# Patient Record
Sex: Female | Born: 1995 | Race: White | Hispanic: No | Marital: Married | Smoking: Former smoker
Health system: Southern US, Community
[De-identification: ages and names within clinical notes are randomized; demographics above are authoritative.]

## PROBLEM LIST (undated history)

## (undated) DIAGNOSIS — Z789 Other specified health status: Secondary | ICD-10-CM

## (undated) DIAGNOSIS — K8020 Calculus of gallbladder without cholecystitis without obstruction: Secondary | ICD-10-CM

## (undated) HISTORY — PX: NO PAST SURGERIES: SHX2092

---
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
## 2013-04-19 ENCOUNTER — Encounter: Payer: BC Managed Care – PPO | Admitting: Women's Health

## 2013-06-08 ENCOUNTER — Ambulatory Visit (INDEPENDENT_AMBULATORY_CARE_PROVIDER_SITE_OTHER): Payer: BC Managed Care – PPO | Admitting: Gynecology

## 2013-06-08 ENCOUNTER — Encounter: Payer: Self-pay | Admitting: Gynecology

## 2013-06-08 VITALS — BP 118/70

## 2013-06-08 DIAGNOSIS — Z113 Encounter for screening for infections with a predominantly sexual mode of transmission: Secondary | ICD-10-CM

## 2013-06-08 DIAGNOSIS — R300 Dysuria: Secondary | ICD-10-CM

## 2013-06-08 DIAGNOSIS — N898 Other specified noninflammatory disorders of vagina: Secondary | ICD-10-CM

## 2013-06-08 LAB — URINALYSIS W MICROSCOPIC + REFLEX CULTURE
BILIRUBIN URINE: NEGATIVE
GLUCOSE, UA: NEGATIVE mg/dL
HGB URINE DIPSTICK: NEGATIVE
KETONES UR: NEGATIVE mg/dL
Leukocytes, UA: NEGATIVE
Nitrite: NEGATIVE
PROTEIN: NEGATIVE mg/dL
Specific Gravity, Urine: >1.03 — ABNORMAL HIGH (ref 1.005–1.030)
Urobilinogen, UA: 0.2 mg/dL (ref 0.0–1.0)
pH: 5 (ref 5.0–8.0)

## 2013-06-08 LAB — WET PREP FOR TRICH, YEAST, CLUE
Clue Cells Wet Prep HPF POC: NONE SEEN
TRICH WET PREP: NONE SEEN
YEAST WET PREP: NONE SEEN

## 2013-06-08 LAB — HEPATITIS C ANTIBODY: HCV Ab: NEGATIVE

## 2013-06-08 LAB — HEPATITIS B SURFACE ANTIGEN: Hepatitis B Surface Ag: NEGATIVE

## 2013-06-08 LAB — RPR

## 2013-06-08 LAB — HIV ANTIBODY (ROUTINE TESTING W REFLEX): HIV: NONREACTIVE

## 2013-06-08 MED ORDER — METRONIDAZOLE 0.75 % VA GEL: 1.0000 | Freq: Two times a day (BID) | VAGINAL | Status: DC

## 2013-06-08 NOTE — Patient Instructions (Signed)
Come to office when cycle starts to have depo provera injection

## 2013-06-08 NOTE — Progress Notes (Signed)
   18 year old patient that presented to the office today with symptoms of dysuria and frequency of concern if she was having recurrent urinary tract infections. She also is having some low back discomfort she had a vaginal discharge. She also has informed me that her partner I told her that he has herpes and she wanted to be screened as well as full STD evaluation.  Review of her records indicated that she had Escherichia coli urinary tract infection in August of 2013 in June of 2014. Patient denied any fever, chills, nausea or vomiting.  Exam: Back: No CVA tenderness Abdomen: Soft nontender no rebound guarding Pelvic: Bartholin urethra Skene was within normal limits Vagina: No lesions or discharge Cervix: No lesions or discharge Bimanual exam: Not done  Wet prep few WBC few bacteria  Urinalysis negative  Assessment/plan: Nonspecific bacterial vaginitis will be given MetroGel to apply each bedtime for one week. GC and chlamydia culture pending at time of this dictation. The following lab work was ordered to complete the STD screen: HSV 1 and HSV 2 IgG and IgM, HIV, RPR, hepatitis B and C. and HIV. Her urine will be submitted for culture as well. Review of her rectum medications overdue for her annual exam will make an appointment for next month. Patient was on oral contraceptive pill and discontinued 3 months ago and would like to go back on the Depo-Provera which she had been on in the past and we'll wait for the started the cycle and come in for the injection. Will discuss with her at that point about the HPV vaccine series.

## 2013-06-10 LAB — URINE CULTURE

## 2013-06-10 LAB — GC/CHLAMYDIA PROBE AMP
CT Probe RNA: NEGATIVE
GC Probe RNA: NEGATIVE

## 2013-06-11 LAB — HSV(HERPES SMPLX)ABS-I+II(IGG+IGM)-BLD
HERPES SIMPLEX VRS I-IGM AB (EIA): 0.73 {index}
HSV 1 Glycoprotein G Ab, IgG: <0.1 IV
HSV 2 Glycoprotein G Ab, IgG: <0.1 IV

## 2013-07-11 ENCOUNTER — Encounter: Payer: BC Managed Care – PPO | Admitting: Women's Health

## 2014-11-06 ENCOUNTER — Emergency Department (HOSPITAL_COMMUNITY)
Admission: EM | Admit: 2014-11-06 | Discharge: 2014-11-06 | Disposition: A | Discharge status: Home / Self Care | Payer: BLUE CROSS/BLUE SHIELD | Source: Home / Self Care | Attending: Family Medicine | Admitting: Family Medicine

## 2014-11-06 ENCOUNTER — Encounter (HOSPITAL_COMMUNITY): Payer: Self-pay | Admitting: *Deleted

## 2014-11-06 DIAGNOSIS — L723 Sebaceous cyst: Secondary | ICD-10-CM

## 2014-11-06 MED ORDER — DOXYCYCLINE HYCLATE 100 MG PO CAPS: 100.0000 mg | ORAL_CAPSULE | Freq: Two times a day (BID) | ORAL | Status: DC

## 2014-11-06 NOTE — ED Notes (Signed)
Pt    Has    A  Small  Boil  Behind  Her  r  Ear  That    She  Noticed perhaps 3  Days      Ago      Pt  Reports      The  Area     Is  Tender  To  The  Touch

## 2014-11-06 NOTE — ED Provider Notes (Signed)
CSN: [id number]     Arrival date & time 11/06/14  1332 History   First MD Initiated Contact with Patient 11/06/14 1432     No chief complaint on file.  (Consider location/radiation/quality/duration/timing/severity/associated sxs/prior Treatment) Patient is a 19 y.o. female presenting with abscess.  Abscess Location:  Head/neck Head/neck abscess location:  R ear Abscess quality: fluctuance, painful and redness   Abscess quality: not draining, no induration, no warmth and not weeping   Red streaking: no   Duration:  3 days Progression:  Unchanged Pain details:    Quality:  Pressure   Severity:  Mild Chronicity:  New Relieved by:  None tried Worsened by:  Nothing tried Ineffective treatments:  None tried Associated symptoms: no fever, no nausea and no vomiting   Risk factors: no prior abscess     No past medical history on file. No past surgical history on file. Family History  Problem Relation Age of Onset  . Heart disease Mother   . Hypertension Mother 69    PULMONARY ARTERIAL HYPERTENSION   History  Substance Use Topics  . Smoking status: Never Smoker   . Smokeless tobacco: Never Used  . Alcohol Use: Yes   OB History    Gravida Para Term Preterm AB TAB SAB Ectopic Multiple Living   0              Review of Systems  Constitutional: Negative for fever.  HENT: Positive for ear pain. Negative for ear discharge and facial swelling.   Gastrointestinal: Negative for nausea and vomiting.  Hematological: Negative for adenopathy.    Allergies  Review of patient's allergies indicates no known allergies.  Home Medications   Prior to Admission medications   Medication Sig Start Date End Date Taking? Authorizing Provider  medroxyPROGESTERone (DEPO-PROVERA) 150 MG/ML injection Inject 1 mL (150 mg total) into the muscle every 3 (three) months. 07/28/12   Harrington Challenger, NP  methylphenidate (RITALIN) 20 MG tablet Take 20 mg by mouth 2 (two) times daily.    Historical Provider,  MD  metroNIDAZOLE (METROGEL) 0.75 % vaginal gel Place 1 Applicatorful vaginally 2 (two) times daily. 06/08/13   Ok Edwards, MD   There were no vitals taken for this visit. Physical Exam  Constitutional: She appears well-developed and well-nourished. No distress.  HENT:  Head:    Left Ear: External ear normal.  Eyes: Conjunctivae are normal. Pupils are equal, round, and reactive to light.  Neck: Normal range of motion. Neck supple.  Nursing note and vitals reviewed.   ED Course  Procedures (including critical care time) Labs Review Labs Reviewed - No data to display  Imaging Review No results found.   MDM  No diagnosis found.     Linna Hoff, MD 11/06/14 [phone]

## 2014-11-06 NOTE — Discharge Instructions (Signed)
Warm compress twice a day when you take the antibiotic, take all of medicine, return as needed. °

## 2014-12-17 ENCOUNTER — Telehealth: Payer: Self-pay

## 2014-12-17 NOTE — Telephone Encounter (Signed)
Patient has CE scheduled next week. [place] to [place]. She said she will talk with her about it at viist. I told her that was something [place] could help her with.

## 2014-12-26 ENCOUNTER — Encounter: Payer: Self-pay | Admitting: Women's Health

## 2015-02-18 ENCOUNTER — Emergency Department (INDEPENDENT_AMBULATORY_CARE_PROVIDER_SITE_OTHER): Payer: BLUE CROSS/BLUE SHIELD

## 2015-02-18 ENCOUNTER — Emergency Department (HOSPITAL_COMMUNITY)
Admission: EM | Admit: 2015-02-18 | Discharge: 2015-02-18 | Disposition: A | Discharge status: Home / Self Care | Payer: BLUE CROSS/BLUE SHIELD | Source: Home / Self Care | Attending: Emergency Medicine | Admitting: Emergency Medicine

## 2015-02-18 ENCOUNTER — Encounter (HOSPITAL_COMMUNITY): Payer: Self-pay | Admitting: Emergency Medicine

## 2015-02-18 DIAGNOSIS — S96912A Strain of unspecified muscle and tendon at ankle and foot level, left foot, initial encounter: Secondary | ICD-10-CM

## 2015-02-18 DIAGNOSIS — S96911A Strain of unspecified muscle and tendon at ankle and foot level, right foot, initial encounter: Secondary | ICD-10-CM

## 2015-02-18 NOTE — ED Provider Notes (Signed)
CSN: [id number]     Arrival date & time 02/18/15  1656 History   First MD Initiated Contact with Patient 02/18/15 1811     Chief Complaint  Patient presents with  . Foot Pain  . Rash   (Consider location/radiation/quality/duration/timing/severity/associated sxs/prior Treatment) HPI Comments: 19 year old female presents with pain to the left forefoot for one week. Pain and swelling is located primarily to the base of the great toe and over the distal metatarsal. It is worse with weightbearing. She is unaware of any known trauma.  Second complaint is that of an itchy rash to her neck for 2 days.   History reviewed. No pertinent past medical history. History reviewed. No pertinent past surgical history. Family History  Problem Relation Age of Onset  . Heart disease Mother   . Hypertension Mother [id number]    PULMONARY ARTERIAL HYPERTENSION   Social History  Substance Use Topics  . Smoking status: Never Smoker   . Smokeless tobacco: Never Used  . Alcohol Use: Yes   OB History    Gravida Para Term Preterm AB TAB SAB Ectopic Multiple Living   0              Review of Systems  Constitutional: Positive for activity change. Negative for fever.  HENT: Positive for postnasal drip.   Respiratory: Negative.   Genitourinary: Negative.   Musculoskeletal:       As per history of present illness  Skin: Positive for rash.  Neurological: Negative.     Allergies  Review of patient's allergies indicates no known allergies.  Home Medications   Prior to Admission medications   Medication Sig Start Date End Date Taking? Authorizing Provider  doxycycline (VIBRAMYCIN) 100 MG capsule Take 1 capsule (100 mg total) by mouth 2 (two) times daily. Patient not taking: Reported on 02/18/2015 11/06/14   Linna HoffJames D Kindl, MD  medroxyPROGESTERone (DEPO-PROVERA) 150 MG/ML injection Inject 1 mL (150 mg total) into the muscle every 3 (three) months. Patient not taking: Reported on 02/18/2015 07/28/12   Harrington ChallengerNancy J  Young, NP  methylphenidate (RITALIN) 20 MG tablet Take 20 mg by mouth 2 (two) times daily.    Historical Provider, MD  metroNIDAZOLE (METROGEL) 0.75 % vaginal gel Place 1 Applicatorful vaginally 2 (two) times daily. Patient not taking: Reported on 02/18/2015 06/08/13   Ok EdwardsJuan H Fernandez, MD   Meds Ordered and Administered this Visit  Medications - No data to display  BP 124/79 mmHg  Pulse 67  Temp(Src) 98.4 F (36.9 C) (Oral)  Resp 16  SpO2 99%  LMP 02/04/2015 No data found.   Physical Exam  Constitutional: She is oriented to person, place, and time. She appears well-developed and well-nourished. No distress.  Neck: Normal range of motion. Neck supple.  Cardiovascular: Normal rate.   Pulmonary/Chest: Effort normal. No respiratory distress.  Musculoskeletal: She exhibits edema and tenderness.  Swelling and tenderness to the left forefoot primarily over the first metatarsal at the base of the great toe. Distal neurovascular motor Sentry is intact. Pedal pulse is 2+.  Neurological: She is alert and oriented to person, place, and time. She exhibits normal muscle tone.  Skin: Skin is warm and dry. No erythema.  No discoloration or obvious rash to the posterior neck. There is an appearance of "goose bumps" to the posterior neck but no erythema, papules or pustules visible.  Psychiatric: She has a normal mood and affect.  Nursing note and vitals reviewed.   ED Course  Procedures (including critical care time)  Labs Review Labs Reviewed - No data to display  Imaging Review Dg Foot Complete Left  02/18/2015  CLINICAL DATA:  Subacute onset of left foot pain, with dorsal swelling. Initial encounter. EXAM: LEFT FOOT - COMPLETE 3+ VIEW COMPARISON:  None. FINDINGS: There is no evidence of fracture or dislocation. The joint spaces are preserved. There is no evidence of talar subluxation; the subtalar joint is unremarkable in appearance. Mild dorsal soft tissue swelling is suggested at the  forefoot. IMPRESSION: No evidence of fracture or dislocation. Electronically Signed   By: Roanna Raider M.D.   On: 02/18/2015 18:54     Visual Acuity Review  Right Eye Distance:   Left Eye Distance:   Bilateral Distance:    Right Eye Near:   Left Eye Near:    Bilateral Near:         MDM   1. Strain of toe of left foot, initial encounter   2. Right foot strain, initial encounter    Most likely this is a strain of the tendons or sprain of the ligaments at the base of the toe and in the forefoot. This should heal over time without much intervention. Do not wear high heels. Do not wear narrowed toe shoes. Apply ice periodically. I take ibuprofen for pain. For any worsening may follow up with your primary care doctor.   Hayden Rasmussen, NP 02/18/15 [phone]

## 2015-02-18 NOTE — ED Notes (Signed)
Patient reports pain in left foot particularly around joint at base of great toe.  Foot swollen, unable to move great toes due to pain.  Can move the remainder of toes.  No known injury.  Patient reports wearing "very high heels" on halloween and was intoxicated-but not aware of any particular injury.   Rash on back of neck for 2 days

## 2015-02-18 NOTE — Discharge Instructions (Signed)
Most likely this is a strain of the tendons or sprain of the ligaments at the base of the toe and in the forefoot. This should heal over time without much intervention. Do not wear high heels. Do not wear narrowed toe shoes. Apply ice periodically. I take ibuprofen for pain. For any worsening may follow up with your primary care doctor.

## 2015-06-05 ENCOUNTER — Encounter: Payer: Self-pay | Admitting: Women's Health

## 2015-06-23 DIAGNOSIS — Z1321 Encounter for screening for nutritional disorder: Secondary | ICD-10-CM | POA: Insufficient documentation

## 2015-06-23 DIAGNOSIS — F4541 Pain disorder exclusively related to psychological factors: Secondary | ICD-10-CM

## 2015-06-23 DIAGNOSIS — Z8659 Personal history of other mental and behavioral disorders: Secondary | ICD-10-CM

## 2015-06-23 DIAGNOSIS — E669 Obesity, unspecified: Secondary | ICD-10-CM | POA: Insufficient documentation

## 2015-06-23 HISTORY — DX: Pain disorder exclusively related to psychological factors: F45.41

## 2015-06-23 HISTORY — DX: Personal history of other mental and behavioral disorders: Z86.59

## 2016-05-19 ENCOUNTER — Ambulatory Visit (INDEPENDENT_AMBULATORY_CARE_PROVIDER_SITE_OTHER): Payer: BLUE CROSS/BLUE SHIELD | Admitting: Women's Health

## 2016-05-19 ENCOUNTER — Encounter: Payer: Self-pay | Admitting: Women's Health

## 2016-05-19 VITALS — BP 124/80 | Ht 63.5 in | Wt 185.0 lb

## 2016-05-19 DIAGNOSIS — N926 Irregular menstruation, unspecified: Secondary | ICD-10-CM

## 2016-05-19 DIAGNOSIS — Z01419 Encounter for gynecological examination (general) (routine) without abnormal findings: Secondary | ICD-10-CM

## 2016-05-19 LAB — CBC WITH DIFFERENTIAL/PLATELET
BASOS ABS: 82 {cells}/uL (ref 0–200)
Basophils Relative: 1 %
Eosinophils Absolute: 82 cells/uL (ref 15–500)
Eosinophils Relative: 1 %
HEMATOCRIT: 43.1 % (ref 35.0–45.0)
HEMOGLOBIN: 14.3 g/dL (ref 11.7–15.5)
LYMPHS PCT: 35 %
Lymphs Abs: 2870 cells/uL (ref 850–3900)
MCH: 29.3 pg (ref 27.0–33.0)
MCHC: 33.2 g/dL (ref 32.0–36.0)
MCV: 88.3 fL (ref 80.0–100.0)
MPV: 9.8 fL (ref 7.5–12.5)
Monocytes Absolute: 492 cells/uL (ref 200–950)
Monocytes Relative: 6 %
NEUTROS PCT: 57 %
Neutro Abs: 4674 cells/uL (ref 1500–7800)
Platelets: 347 10*3/uL (ref 140–400)
RBC: 4.88 MIL/uL (ref 3.80–5.10)
RDW: 12.4 % (ref 11.0–15.0)
WBC: 8.2 10*3/uL (ref 3.8–10.8)

## 2016-05-19 LAB — TSH: TSH: 1.12 mIU/L

## 2016-05-19 NOTE — Progress Notes (Signed)
[name] [name] 04/10/1995 [id number]    History:    Presents for annual exam.  Last here 2015, reports having a negative STD screen with current partner. Received gardasil. Monthly 1-2 day cycles trying to conceive for the past 6 months. Reports husband is healthy but his brother had a low sperm count and questions if her husband does also.Marland Kitchen.  Past medical history, past surgical history, family history and social history were all reviewed and documented in the EPIC chart. 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  ROS:  A ROS was performed and pertinent positives and negatives are included.  Exam:  Vitals:   05/19/16 1149  BP: 124/80  Weight: 185 lb (83.9 kg)  Height: 5' 3.5" (1.613 m)   Body mass index is 32.26 kg/m.   General appearance:  Normal Thyroid:  Symmetrical, normal in size, without palpable masses or nodularity. Respiratory  Auscultation:  Clear without wheezing or rhonchi Cardiovascular  Auscultation:  Regular rate, without rubs, murmurs or gallops  Edema/varicosities:  Not grossly evident Abdominal  Soft,nontender, without masses, guarding or rebound.  Liver/spleen:  No organomegaly noted  Hernia:  None appreciated  Skin  Inspection:  Grossly normal   Breasts: Examined lying and sitting.     Right: Without masses, retractions, discharge or axillary adenopathy.     Left: Without masses, retractions, discharge or axillary adenopathy. Gentitourinary   Inguinal/mons:  Normal without inguinal adenopathy  External genitalia:  Normal  BUS/Urethra/Skene's glands:  Normal  Vagina:  Normal  Cervix:  Normal  Uterus: normal in size, shape and contour.  Midline and mobile  Adnexa/parametria:     Rt: Without masses or tenderness.   Lt: Without masses or tenderness.  Anus and perineum: Normal    Assessment/Plan:  21 y.o. [name] [name] for annual exam.   Monthly 1-2 day cycles desiring conception Obesity  Plan: On day 4 of cycle, will check FSH, estradiol, TSH, prolactin, CBC,  and Pap. Reviewed if normal will check progesterone on day 22-25 of cycle. Semen analysis. SBE's, exercise, decrease calories/carbs, calcium rich diet, MVI daily encouraged. Healthy pregnancy behaviors reviewed. Encouraged to increase intercourse frequency.   Harrington ChallengerYOUNG,[name] [name] WHNP, 1:21 PM 05/19/2016

## 2016-05-19 NOTE — Patient Instructions (Signed)

## 2016-05-20 ENCOUNTER — Telehealth: Payer: Self-pay | Admitting: *Deleted

## 2016-05-20 LAB — FOLLICLE STIMULATING HORMONE: FSH: 6.3 m[IU]/mL

## 2016-05-20 LAB — PROLACTIN: Prolactin: 9.7 ng/mL

## 2016-05-20 NOTE — Telephone Encounter (Signed)
Pt called asking if you would be willing to [facility], pt does have PCP that she could get referral from as well rather than GYN office, would you prefer for patient to get this from PCP? As we have no documentation to send to [facility] regarding this which they are going to require. Patient said she is overweight and her mother has heart disease. Please advise

## 2016-05-20 NOTE — Telephone Encounter (Signed)
Couldn't we just refer her for obesity and family history of heart disease ? Would obesity  not  be enough for a referral?.

## 2016-05-20 NOTE — Telephone Encounter (Signed)
Pt will get information and let me know to schedule.

## 2016-05-21 LAB — PAP IG W/ RFLX HPV ASCU

## 2016-05-21 NOTE — Telephone Encounter (Signed)
Pt called back with name of Delman KittenDebra Rosenquist [phone] she is with [facility], I called and was informed they are only accepting new patients that [facility]. I called pt and her PCP is with [facility] so pt will be able to get referral via PCP. Pt verbalized she understood this.

## 2016-05-28 LAB — ESTRADIOL, FREE
Estradiol, Free: 0.97 pg/mL
Estradiol: 60 pg/mL

## 2016-05-31 ENCOUNTER — Other Ambulatory Visit: Payer: Self-pay | Admitting: Women's Health

## 2016-05-31 DIAGNOSIS — Z319 Encounter for procreative management, unspecified: Secondary | ICD-10-CM

## 2016-07-08 ENCOUNTER — Ambulatory Visit (INDEPENDENT_AMBULATORY_CARE_PROVIDER_SITE_OTHER): Payer: BLUE CROSS/BLUE SHIELD | Admitting: Gynecology

## 2016-07-08 ENCOUNTER — Encounter: Payer: Self-pay | Admitting: Gynecology

## 2016-07-08 VITALS — BP 122/80 | Ht 63.5 in | Wt 189.2 lb

## 2016-07-08 DIAGNOSIS — Z3A01 Less than 8 weeks gestation of pregnancy: Secondary | ICD-10-CM

## 2016-07-08 DIAGNOSIS — N912 Amenorrhea, unspecified: Secondary | ICD-10-CM

## 2016-07-08 DIAGNOSIS — R110 Nausea: Secondary | ICD-10-CM

## 2016-07-08 DIAGNOSIS — Z3200 Encounter for pregnancy test, result unknown: Secondary | ICD-10-CM | POA: Insufficient documentation

## 2016-07-08 LAB — PREGNANCY, URINE: Preg Test, Ur: POSITIVE — AB

## 2016-07-08 MED ORDER — METOCLOPRAMIDE HCL 10 MG PO TABS: 10.0000 mg | ORAL_TABLET | Freq: Three times a day (TID) | ORAL | 1 refills | Status: DC

## 2016-07-08 NOTE — Progress Notes (Signed)
   Patient is a 21 year old now gravida 1 para 0 that presented to the office today stating that her last menstrual period was 06/03/2016. She has been trying to get pregnant for the past 6 months. She reports normal menstrual cycles. She did 3 home urine pregnancy test which were positive. Patient with some nausea slight breast tenderness no vomiting and no vaginal bleeding. Patient denies any past history of any STDs or any pelvic surgery and is currently married. Both her and her husband denied any family history of any genetic disorders.  Exam: Abdomen: Soft nontender no rebound or guarding Pelvic: Bartholin urethra Skene was within normal limits Vagina: No lesions or discharge Cervix: No lesions or discharge Uterus: Slightly anteverted four-week size nontender Adnexa: No palpable mass or tenderness Rectal exam: Not done  Urine presented test done here in the office was positive  Assessment/plan: Patient with with early pregnancy based on last menstrual period she would be approximate 4-6/[redacted] weeks gestation with a tentative estimated date of confinement 03/11/2017. She will have a quantitative beta-hCG today repeat in 72 hours. We'll tentatively schedule an ultrasound for viability and dating for 2 weeks. She was instructed to continue her prenatal vitamins. For the nausea given up her prescription for Reglan to take 1 by mouth every 6-8 hours when necessary. Literature information on first trimester pregnancy was provided.

## 2016-07-08 NOTE — Patient Instructions (Signed)
First Trimester of Pregnancy The first trimester of pregnancy is from week 1 until the end of week 13 (months 1 through 3). A week after a sperm fertilizes an egg, the egg will implant on the wall of the uterus. This embryo will begin to develop into a baby. Genes from you and your partner will form the baby. The female genes will determine whether the baby will be a boy or a girl. At 6-8 weeks, the eyes and face will be formed, and the heartbeat can be seen on ultrasound. At the end of 12 weeks, all the baby's organs will be formed. Now that you are pregnant, you will want to do everything you can to have a healthy baby. Two of the most important things are to get good prenatal care and to follow your health care provider's instructions. Prenatal care is all the medical care you receive before the baby's birth. This care will help prevent, find, and treat any problems during the pregnancy and childbirth. Body changes during your first trimester Your body goes through many changes during pregnancy. The changes vary from woman to woman.  You may gain or lose a couple of pounds at first.  You may feel sick to your stomach (nauseous) and you may throw up (vomit). If the vomiting is uncontrollable, call your health care provider.  You may tire easily.  You may develop headaches that can be relieved by medicines. All medicines should be approved by your health care provider.  You may urinate more often. Painful urination may mean you have a bladder infection.  You may develop heartburn as a result of your pregnancy.  You may develop constipation because certain hormones are causing the muscles that push stool through your intestines to slow down.  You may develop hemorrhoids or swollen veins (varicose veins).  Your breasts may begin to grow larger and become tender. Your nipples may stick out more, and the tissue that surrounds them (areola) may become darker.  Your gums may bleed and may be  sensitive to brushing and flossing.  Dark spots or blotches (chloasma, mask of pregnancy) may develop on your face. This will likely fade after the baby is born.  Your menstrual periods will stop.  You may have a loss of appetite.  You may develop cravings for certain kinds of food.  You may have changes in your emotions from day to day, such as being excited to be pregnant or being concerned that something may go wrong with the pregnancy and baby.  You may have more vivid and strange dreams.  You may have changes in your hair. These can include thickening of your hair, rapid growth, and changes in texture. Some women also have hair loss during or after pregnancy, or hair that feels dry or thin. Your hair will most likely return to normal after your baby is born.  What to expect at prenatal visits During a routine prenatal visit:  You will be weighed to make sure you and the baby are growing normally.  Your blood pressure will be taken.  Your abdomen will be measured to track your baby's growth.  The fetal heartbeat will be listened to between weeks 10 and 14 of your pregnancy.  Test results from any previous visits will be discussed.  Your health care provider may ask you:  How you are feeling.  If you are feeling the baby move.  If you have had any abnormal symptoms, such as leaking fluid, bleeding, severe headaches,   or abdominal cramping.  If you are using any tobacco products, including cigarettes, chewing tobacco, and electronic cigarettes.  If you have any questions.  Other tests that may be performed during your first trimester include:  Blood tests to find your blood type and to check for the presence of any previous infections. The tests will also be used to check for low iron levels (anemia) and protein on red blood cells (Rh antibodies). Depending on your risk factors, or if you previously had diabetes during pregnancy, you may have tests to check for high blood  sugar that affects pregnant women (gestational diabetes).  Urine tests to check for infections, diabetes, or protein in the urine.  An ultrasound to confirm the proper growth and development of the baby.  Fetal screens for spinal cord problems (spina bifida) and Down syndrome.  HIV (human immunodeficiency virus) testing. Routine prenatal testing includes screening for HIV, unless you choose not to have this test.  You may need other tests to make sure you and the baby are doing well.  Follow these instructions at home: Medicines  Follow your health care provider's instructions regarding medicine use. Specific medicines may be either safe or unsafe to take during pregnancy.  Take a prenatal vitamin that contains at least 600 micrograms (mcg) of folic acid.  If you develop constipation, try taking a stool softener if your health care provider approves. Eating and drinking  Eat a balanced diet that includes fresh fruits and vegetables, whole grains, good sources of protein such as meat, eggs, or tofu, and low-fat dairy. Your health care provider will help you determine the amount of weight gain that is right for you.  Avoid raw meat and uncooked cheese. These carry germs that can cause birth defects in the baby.  Eating four or five small meals rather than three large meals a day may help relieve nausea and vomiting. If you start to feel nauseous, eating a few soda crackers can be helpful. Drinking liquids between meals, instead of during meals, also seems to help ease nausea and vomiting.  Limit foods that are high in fat and processed sugars, such as fried and sweet foods.  To prevent constipation: ? Eat foods that are high in fiber, such as fresh fruits and vegetables, whole grains, and beans. ? Drink enough fluid to keep your urine clear or pale yellow. Activity  Exercise only as directed by your health care provider. Most women can continue their usual exercise routine during  pregnancy. Try to exercise for 30 minutes at least 5 days a week. Exercising will help you: ? Control your weight. ? Stay in shape. ? Be prepared for labor and delivery.  Experiencing pain or cramping in the lower abdomen or lower back is a good sign that you should stop exercising. Check with your health care provider before continuing with normal exercises.  Try to avoid standing for long periods of time. Move your legs often if you must stand in one place for a long time.  Avoid heavy lifting.  Wear low-heeled shoes and practice good posture.  You may continue to have sex unless your health care provider tells you not to. Relieving pain and discomfort  Wear a good support bra to relieve breast tenderness.  Take warm sitz baths to soothe any pain or discomfort caused by hemorrhoids. Use hemorrhoid cream if your health care provider approves.  Rest with your legs elevated if you have leg cramps or low back pain.  If you develop   varicose veins in your legs, wear support hose. Elevate your feet for 15 minutes, 3-4 times a day. Limit salt in your diet. Prenatal care  Schedule your prenatal visits by the twelfth week of pregnancy. They are usually scheduled monthly at first, then more often in the last 2 months before delivery.  Write down your questions. Take them to your prenatal visits.  Keep all your prenatal visits as told by your health care provider. This is important. Safety  Wear your seat belt at all times when driving.  Make a list of emergency phone numbers, including numbers for family, friends, the hospital, and police and fire departments. General instructions  Ask your health care provider for a referral to a local prenatal education class. Begin classes no later than the beginning of month 6 of your pregnancy.  Ask for help if you have counseling or nutritional needs during pregnancy. Your health care provider can offer advice or refer you to specialists for help  with various needs.  Do not use hot tubs, steam rooms, or saunas.  Do not douche or use tampons or scented sanitary pads.  Do not cross your legs for long periods of time.  Avoid cat litter boxes and soil used by cats. These carry germs that can cause birth defects in the baby and possibly loss of the fetus by miscarriage or stillbirth.  Avoid all smoking, herbs, alcohol, and medicines not prescribed by your health care provider. Chemicals in these products affect the formation and growth of the baby.  Do not use any products that contain nicotine or tobacco, such as cigarettes and e-cigarettes. If you need help quitting, ask your health care provider. You may receive counseling support and other resources to help you quit.  Schedule a dentist appointment. At home, brush your teeth with a soft toothbrush and be gentle when you floss. Contact a health care provider if:  You have dizziness.  You have mild pelvic cramps, pelvic pressure, or nagging pain in the abdominal area.  You have persistent nausea, vomiting, or diarrhea.  You have a bad smelling vaginal discharge.  You have pain when you urinate.  You notice increased swelling in your face, hands, legs, or ankles.  You are exposed to fifth disease or chickenpox.  You are exposed to German measles (rubella) and have never had it. Get help right away if:  You have a fever.  You are leaking fluid from your vagina.  You have spotting or bleeding from your vagina.  You have severe abdominal cramping or pain.  You have rapid weight gain or loss.  You vomit blood or material that looks like coffee grounds.  You develop a severe headache.  You have shortness of breath.  You have any kind of trauma, such as from a fall or a car accident. Summary  The first trimester of pregnancy is from week 1 until the end of week 13 (months 1 through 3).  Your body goes through many changes during pregnancy. The changes vary from  woman to woman.  You will have routine prenatal visits. During those visits, your health care provider will examine you, discuss any test results you may have, and talk with you about how you are feeling. This information is not intended to replace advice given to you by your health care provider. Make sure you discuss any questions you have with your health care provider. Document Released: 03/16/2001 Document Revised: 03/03/2016 Document Reviewed: 03/03/2016 Elsevier Interactive Patient Education  2017 [employer]  

## 2016-07-09 LAB — HCG, QUANTITATIVE, PREGNANCY: hCG, Beta Chain, Quant, S: 86.4 m[IU]/mL — ABNORMAL HIGH

## 2016-07-20 ENCOUNTER — Other Ambulatory Visit: Payer: BLUE CROSS/BLUE SHIELD

## 2016-07-20 ENCOUNTER — Other Ambulatory Visit: Payer: Self-pay | Admitting: Gynecology

## 2016-07-20 DIAGNOSIS — Z3A01 Less than 8 weeks gestation of pregnancy: Secondary | ICD-10-CM

## 2016-07-20 DIAGNOSIS — Z3491 Encounter for supervision of normal pregnancy, unspecified, first trimester: Secondary | ICD-10-CM

## 2016-07-20 LAB — HCG, QUANTITATIVE, PREGNANCY: hCG, Beta Chain, Quant, S: 24703.1 m[IU]/mL — ABNORMAL HIGH

## 2016-07-22 ENCOUNTER — Encounter: Payer: Self-pay | Admitting: Gynecology

## 2016-07-22 ENCOUNTER — Other Ambulatory Visit: Payer: Self-pay | Admitting: Gynecology

## 2016-07-22 ENCOUNTER — Ambulatory Visit (INDEPENDENT_AMBULATORY_CARE_PROVIDER_SITE_OTHER): Payer: BLUE CROSS/BLUE SHIELD | Admitting: Gynecology

## 2016-07-22 ENCOUNTER — Ambulatory Visit (INDEPENDENT_AMBULATORY_CARE_PROVIDER_SITE_OTHER): Payer: BLUE CROSS/BLUE SHIELD

## 2016-07-22 DIAGNOSIS — Z3491 Encounter for supervision of normal pregnancy, unspecified, first trimester: Secondary | ICD-10-CM

## 2016-07-22 DIAGNOSIS — O368311 Maternal care for abnormalities of the fetal heart rate or rhythm, first trimester, fetus 1: Secondary | ICD-10-CM

## 2016-07-22 DIAGNOSIS — O3680X0 Pregnancy with inconclusive fetal viability, not applicable or unspecified: Secondary | ICD-10-CM

## 2016-07-22 DIAGNOSIS — O2691 Pregnancy related conditions, unspecified, first trimester: Secondary | ICD-10-CM | POA: Diagnosis not present

## 2016-07-22 NOTE — Progress Notes (Signed)
   Patient is a 21 year old now gravida 1 para 0 who was seen in the office on April 5 as a result of 3 positive home pregnancy test which was confirmed in the office. She had serial quantitative beta-hCG's that were drawn and the results were as follows:  Results for [name] [name], [name] [name] (MRN [id number]) as of 07/22/2016 16:05  Ref. Range 07/08/2016 12:57 07/20/2016 09:51  HCG, Beta Chain, Quant, S Latest Units: mIU/mL 86.4 (H) 24,703.1 (H)   Patient is doing well some nausea is taking Reglan when necessary and is on prenatal vitamins reports no vaginal bleeding. Ultrasound today: By last menstrual period patient would be 7 weeks with a due date 03/10/2017 by ultrasound today 5 weeks and 4 days with a due date 03/20/2017  Intrauterine gestational sac less than dates noted faint cardiac activity 84 bpm with reported with a normal yolk sac. Adjacent to the gestational sac a fluid-filled irregular area measuring 6 x 6 x 2 mm was noted the hypoechoic ring could not rule out the possibility of a twin gestation versus a hematoma. Right ovary was normal left ovary corpus luteum cyst measuring 16 x 17 mm. No fluid in cul-de-sac. No apparent adnexal masses.  Assessment/plan: First trimester pregnancy size less than dates patient could've conceived later than based on her last menstrual period. Questionable vanishing twin versus small hematoma will take threatened miscarriage precautions and return back to the office in 2 weeks for an ultrasound. Quantitative beta-hCGs have been rising nicely. Instructions provided.

## 2016-07-27 ENCOUNTER — Telehealth: Payer: Self-pay

## 2016-07-27 NOTE — Telephone Encounter (Signed)
She can take a Colace tablet when necessary. Drink plenty of fluids increase her fiber intake. No alcohol no heart tones or Jacuzzis and she can take plain Tylenol when necessary. No dietary restriction

## 2016-07-27 NOTE — Telephone Encounter (Signed)
Patient said she was in last week and found out she was early pregnant. Concerned because she did not get a list of medications she can take, Foods she should not eat and nutrients she needs.  Also, she is constipated and you had recommend no straining and no abd exercises for 2 weeks. What to rec?

## 2016-07-28 NOTE — Telephone Encounter (Signed)
Patient informed. 

## 2016-08-02 ENCOUNTER — Other Ambulatory Visit: Payer: BLUE CROSS/BLUE SHIELD

## 2016-08-02 ENCOUNTER — Ambulatory Visit: Payer: BLUE CROSS/BLUE SHIELD | Admitting: Gynecology

## 2016-08-04 ENCOUNTER — Ambulatory Visit (INDEPENDENT_AMBULATORY_CARE_PROVIDER_SITE_OTHER): Payer: BLUE CROSS/BLUE SHIELD

## 2016-08-04 ENCOUNTER — Ambulatory Visit (INDEPENDENT_AMBULATORY_CARE_PROVIDER_SITE_OTHER): Payer: BLUE CROSS/BLUE SHIELD | Admitting: Gynecology

## 2016-08-04 ENCOUNTER — Other Ambulatory Visit: Payer: Self-pay | Admitting: Gynecology

## 2016-08-04 ENCOUNTER — Encounter: Payer: Self-pay | Admitting: Gynecology

## 2016-08-04 VITALS — BP 126/80

## 2016-08-04 DIAGNOSIS — O200 Threatened abortion: Secondary | ICD-10-CM

## 2016-08-04 DIAGNOSIS — Z3490 Encounter for supervision of normal pregnancy, unspecified, unspecified trimester: Secondary | ICD-10-CM

## 2016-08-04 DIAGNOSIS — N898 Other specified noninflammatory disorders of vagina: Secondary | ICD-10-CM | POA: Diagnosis not present

## 2016-08-04 DIAGNOSIS — Z3A08 8 weeks gestation of pregnancy: Secondary | ICD-10-CM

## 2016-08-04 DIAGNOSIS — Z3491 Encounter for supervision of normal pregnancy, unspecified, first trimester: Secondary | ICD-10-CM

## 2016-08-04 LAB — WET PREP FOR TRICH, YEAST, CLUE
Clue Cells Wet Prep HPF POC: NONE SEEN
Trich, Wet Prep: NONE SEEN
WBC, Wet Prep HPF POC: NONE SEEN
YEAST WET PREP: NONE SEEN

## 2016-08-04 NOTE — Progress Notes (Signed)
   Patient is a 21 year old now gravida 1 who was seen in the office on April 19 and is here for follow-up ultrasound based on findings at time of ultrasound at that office visit. Her history is as follows  "Patient was seen in the office on April 5 as a result of 3 positive home pregnancy test which was confirmed in the office. She had serial quantitative beta-hCG's that were drawn and the results were as follows:  Results for SUNSHINE, MACKOWSKI (MRN [id number]) as of 07/22/2016 16:05  Ref. Range 07/08/2016 12:57 07/20/2016 09:51  HCG, Beta Chain, Quant, S Latest Units: mIU/mL 86.4 (H) 24,703.1 (H)   Patient is doing well some nausea is taking Reglan when necessary and is on prenatal vitamins reports no vaginal bleeding. Ultrasound today: By last menstrual period patient would be 7 weeks with a due date 03/10/2017 by ultrasound today 5 weeks and 4 days with a due date 03/20/2017  Intrauterine gestational sac less than dates noted faint cardiac activity 84 bpm with reported with a normal yolk sac. Adjacent to the gestational sac a fluid-filled irregular area measuring 6 x 6 x 2 mm was noted the hypoechoic ring could not rule out the possibility of a twin gestation versus a hematoma. Right ovary was normal left ovary corpus luteum cyst measuring 16 x 17 mm. No fluid in cul-de-sac. No apparent adnexal masses."  The assessment and plan from an office visit was reported as follows: "First trimester pregnancy size less than dates patient could've conceived later than based on her last menstrual period. Questionable vanishing twin versus small hematoma will take threatened miscarriage precautions and return back to the office in 2 weeks for an ultrasound. Quantitative beta-hCGs have been rising nicely. Instructions provided"  Patient was experiencing slight vaginal discharge with no overt no pruritus so wet prep was done and was reported to be normal just few bacteria  Ultrasound today: Intrauterine pregnancy  size consistent with dates. Fetal pole crown-rump length 10.8 cm consistent with 7 weeks, cardiac activity 144 bpm were reported, cervix is long closed. Located cervix hypoechoic focus 10 x 4 x 17 mm with cystic area located at the cervix. Right ovary was normal left ovary corpus luteum cyst measuring 22 x 18 mm. Negative fluid in the cul-de-sac.  Assessment/plan: First trimester pregnancy size consistent with dates approximately 7 weeks with an estimated date of confinement 03/23/2017 still questionable small vanishing twin what appears to be a cystic area resorbing The cervix. Patient will be instructed to refrain from intercourse or strenuous activity until she is out of the first trimester. Literature information on do's and don'ts during the first trimester pregnancy was provided. Copy of the ultrasound was provided. She is on her prenatal vitamins. Patient was provided with the telephone number names of obstetrician in the community to follow her for her obstetrical care.

## 2016-08-04 NOTE — Patient Instructions (Signed)
First Trimester of Pregnancy The first trimester of pregnancy is from week 1 until the end of week 13 (months 1 through 3). A week after a sperm fertilizes an egg, the egg will implant on the wall of the uterus. This embryo will begin to develop into a baby. Genes from you and your partner will form the baby. The female genes will determine whether the baby will be a boy or a girl. At 6-8 weeks, the eyes and face will be formed, and the heartbeat can be seen on ultrasound. At the end of 12 weeks, all the baby's organs will be formed. Now that you are pregnant, you will want to do everything you can to have a healthy baby. Two of the most important things are to get good prenatal care and to follow your health care provider's instructions. Prenatal care is all the medical care you receive before the baby's birth. This care will help prevent, find, and treat any problems during the pregnancy and childbirth. Body changes during your first trimester Your body goes through many changes during pregnancy. The changes vary from woman to woman.  You may gain or lose a couple of pounds at first.  You may feel sick to your stomach (nauseous) and you may throw up (vomit). If the vomiting is uncontrollable, call your health care provider.  You may tire easily.  You may develop headaches that can be relieved by medicines. All medicines should be approved by your health care provider.  You may urinate more often. Painful urination may mean you have a bladder infection.  You may develop heartburn as a result of your pregnancy.  You may develop constipation because certain hormones are causing the muscles that push stool through your intestines to slow down.  You may develop hemorrhoids or swollen veins (varicose veins).  Your breasts may begin to grow larger and become tender. Your nipples may stick out more, and the tissue that surrounds them (areola) may become darker.  Your gums may bleed and may be  sensitive to brushing and flossing.  Dark spots or blotches (chloasma, mask of pregnancy) may develop on your face. This will likely fade after the baby is born.  Your menstrual periods will stop.  You may have a loss of appetite.  You may develop cravings for certain kinds of food.  You may have changes in your emotions from day to day, such as being excited to be pregnant or being concerned that something may go wrong with the pregnancy and baby.  You may have more vivid and strange dreams.  You may have changes in your hair. These can include thickening of your hair, rapid growth, and changes in texture. Some women also have hair loss during or after pregnancy, or hair that feels dry or thin. Your hair will most likely return to normal after your baby is born.  What to expect at prenatal visits During a routine prenatal visit:  You will be weighed to make sure you and the baby are growing normally.  Your blood pressure will be taken.  Your abdomen will be measured to track your baby's growth.  The fetal heartbeat will be listened to between weeks 10 and 14 of your pregnancy.  Test results from any previous visits will be discussed.  Your health care provider may ask you:  How you are feeling.  If you are feeling the baby move.  If you have had any abnormal symptoms, such as leaking fluid, bleeding, severe headaches,   or abdominal cramping.  If you are using any tobacco products, including cigarettes, chewing tobacco, and electronic cigarettes.  If you have any questions.  Other tests that may be performed during your first trimester include:  Blood tests to find your blood type and to check for the presence of any previous infections. The tests will also be used to check for low iron levels (anemia) and protein on red blood cells (Rh antibodies). Depending on your risk factors, or if you previously had diabetes during pregnancy, you may have tests to check for high blood  sugar that affects pregnant women (gestational diabetes).  Urine tests to check for infections, diabetes, or protein in the urine.  An ultrasound to confirm the proper growth and development of the baby.  Fetal screens for spinal cord problems (spina bifida) and Down syndrome.  HIV (human immunodeficiency virus) testing. Routine prenatal testing includes screening for HIV, unless you choose not to have this test.  You may need other tests to make sure you and the baby are doing well.  Follow these instructions at home: Medicines  Follow your health care provider's instructions regarding medicine use. Specific medicines may be either safe or unsafe to take during pregnancy.  Take a prenatal vitamin that contains at least 600 micrograms (mcg) of folic acid.  If you develop constipation, try taking a stool softener if your health care provider approves. Eating and drinking  Eat a balanced diet that includes fresh fruits and vegetables, whole grains, good sources of protein such as meat, eggs, or tofu, and low-fat dairy. Your health care provider will help you determine the amount of weight gain that is right for you.  Avoid raw meat and uncooked cheese. These carry germs that can cause birth defects in the baby.  Eating four or five small meals rather than three large meals a day may help relieve nausea and vomiting. If you start to feel nauseous, eating a few soda crackers can be helpful. Drinking liquids between meals, instead of during meals, also seems to help ease nausea and vomiting.  Limit foods that are high in fat and processed sugars, such as fried and sweet foods.  To prevent constipation: ? Eat foods that are high in fiber, such as fresh fruits and vegetables, whole grains, and beans. ? Drink enough fluid to keep your urine clear or pale yellow. Activity  Exercise only as directed by your health care provider. Most women can continue their usual exercise routine during  pregnancy. Try to exercise for 30 minutes at least 5 days a week. Exercising will help you: ? Control your weight. ? Stay in shape. ? Be prepared for labor and delivery.  Experiencing pain or cramping in the lower abdomen or lower back is a good sign that you should stop exercising. Check with your health care provider before continuing with normal exercises.  Try to avoid standing for long periods of time. Move your legs often if you must stand in one place for a long time.  Avoid heavy lifting.  Wear low-heeled shoes and practice good posture.  You may continue to have sex unless your health care provider tells you not to. Relieving pain and discomfort  Wear a good support bra to relieve breast tenderness.  Take warm sitz baths to soothe any pain or discomfort caused by hemorrhoids. Use hemorrhoid cream if your health care provider approves.  Rest with your legs elevated if you have leg cramps or low back pain.  If you develop   varicose veins in your legs, wear support hose. Elevate your feet for 15 minutes, 3-4 times a day. Limit salt in your diet. Prenatal care  Schedule your prenatal visits by the twelfth week of pregnancy. They are usually scheduled monthly at first, then more often in the last 2 months before delivery.  Write down your questions. Take them to your prenatal visits.  Keep all your prenatal visits as told by your health care provider. This is important. Safety  Wear your seat belt at all times when driving.  Make a list of emergency phone numbers, including numbers for family, friends, the hospital, and police and fire departments. General instructions  Ask your health care provider for a referral to a local prenatal education class. Begin classes no later than the beginning of month 6 of your pregnancy.  Ask for help if you have counseling or nutritional needs during pregnancy. Your health care provider can offer advice or refer you to specialists for help  with various needs.  Do not use hot tubs, steam rooms, or saunas.  Do not douche or use tampons or scented sanitary pads.  Do not cross your legs for long periods of time.  Avoid cat litter boxes and soil used by cats. These carry germs that can cause birth defects in the baby and possibly loss of the fetus by miscarriage or stillbirth.  Avoid all smoking, herbs, alcohol, and medicines not prescribed by your health care provider. Chemicals in these products affect the formation and growth of the baby.  Do not use any products that contain nicotine or tobacco, such as cigarettes and e-cigarettes. If you need help quitting, ask your health care provider. You may receive counseling support and other resources to help you quit.  Schedule a dentist appointment. At home, brush your teeth with a soft toothbrush and be gentle when you floss. Contact a health care provider if:  You have dizziness.  You have mild pelvic cramps, pelvic pressure, or nagging pain in the abdominal area.  You have persistent nausea, vomiting, or diarrhea.  You have a bad smelling vaginal discharge.  You have pain when you urinate.  You notice increased swelling in your face, hands, legs, or ankles.  You are exposed to fifth disease or chickenpox.  You are exposed to German measles (rubella) and have never had it. Get help right away if:  You have a fever.  You are leaking fluid from your vagina.  You have spotting or bleeding from your vagina.  You have severe abdominal cramping or pain.  You have rapid weight gain or loss.  You vomit blood or material that looks like coffee grounds.  You develop a severe headache.  You have shortness of breath.  You have any kind of trauma, such as from a fall or a car accident. Summary  The first trimester of pregnancy is from week 1 until the end of week 13 (months 1 through 3).  Your body goes through many changes during pregnancy. The changes vary from  woman to woman.  You will have routine prenatal visits. During those visits, your health care provider will examine you, discuss any test results you may have, and talk with you about how you are feeling. This information is not intended to replace advice given to you by your health care provider. Make sure you discuss any questions you have with your health care provider. Document Released: 03/16/2001 Document Revised: 03/03/2016 Document Reviewed: 03/03/2016 Elsevier Interactive Patient Education  2017 [employer]  

## 2016-08-18 ENCOUNTER — Encounter: Payer: Self-pay | Admitting: Gynecology

## 2016-08-27 ENCOUNTER — Telehealth: Payer: Self-pay

## 2016-08-27 MED ORDER — DOXYLAMINE-PYRIDOXINE 10-10 MG PO TBEC: 1.0000 | DELAYED_RELEASE_TABLET | Freq: Two times a day (BID) | ORAL | 1 refills | Status: DC

## 2016-08-27 NOTE — Telephone Encounter (Signed)
Encounter already opened. 

## 2016-08-27 NOTE — Telephone Encounter (Addendum)
Not familiar with Diclegis. I brought up the Rx here. Can you complete Rx for me. Also, how much Vit B6?

## 2016-08-27 NOTE — Telephone Encounter (Signed)
[redacted] weeks pregnant. Dr. Fontaine NoFernandez's patient. Sick with vomiting x 2 weeks (she questioned maybe morning sickness) and diarrhea x 2 days. Had a fever earlier past two days 99.8 but no fever today.  She asked what she should do for this.

## 2016-08-27 NOTE — Telephone Encounter (Signed)
11 wks with Vomitting x 2 wks.  Please recommend Vit B6 and send Diclegis prescription.  Diarrhea with subfebrile state could be a viral Gastro-Enteritis, but no fever now, hopefully improving.  Will need very good hydration and increased K+ intake (bananas...).  If thinks that she is dehydrated[facility]  No Obstetrician yet?

## 2016-08-27 NOTE — Telephone Encounter (Signed)
Spoke with patient and informed and instructed her on all this.  She has OB appt in  3 weeks.

## 2016-08-27 NOTE — Telephone Encounter (Signed)
Recommended dosage of Vit B6 for severe N/V of pregnancy is 25 to 50 mg TID as needed.  Diclegis recommended dosage is to start with 2 tab at bedtime.  If not enough after 2 days, add 1 tab in am.  If still not enough, take the maximum dosage which is 1 tab am, 1 tab at lunch and 2 tab at bedtime.

## 2016-09-09 DIAGNOSIS — Z3401 Encounter for supervision of normal first pregnancy, first trimester: Secondary | ICD-10-CM | POA: Diagnosis not present

## 2016-09-09 DIAGNOSIS — Z3689 Encounter for other specified antenatal screening: Secondary | ICD-10-CM | POA: Diagnosis not present

## 2016-09-09 LAB — OB RESULTS CONSOLE HEPATITIS B SURFACE ANTIGEN: HEP B S AG: NEGATIVE

## 2016-09-09 LAB — OB RESULTS CONSOLE RUBELLA ANTIBODY, IGM: Rubella: IMMUNE

## 2016-09-09 LAB — OB RESULTS CONSOLE HIV ANTIBODY (ROUTINE TESTING): HIV: NONREACTIVE

## 2016-09-30 DIAGNOSIS — Z3402 Encounter for supervision of normal first pregnancy, second trimester: Secondary | ICD-10-CM | POA: Diagnosis not present

## 2016-09-30 DIAGNOSIS — Z361 Encounter for antenatal screening for raised alphafetoprotein level: Secondary | ICD-10-CM | POA: Diagnosis not present

## 2016-09-30 DIAGNOSIS — D72829 Elevated white blood cell count, unspecified: Secondary | ICD-10-CM | POA: Diagnosis not present

## 2016-10-28 DIAGNOSIS — Z3402 Encounter for supervision of normal first pregnancy, second trimester: Secondary | ICD-10-CM | POA: Diagnosis not present

## 2016-10-28 DIAGNOSIS — Z363 Encounter for antenatal screening for malformations: Secondary | ICD-10-CM | POA: Diagnosis not present

## 2016-11-12 DIAGNOSIS — O4402 Placenta previa specified as without hemorrhage, second trimester: Secondary | ICD-10-CM | POA: Diagnosis not present

## 2016-11-12 DIAGNOSIS — Z3A21 21 weeks gestation of pregnancy: Secondary | ICD-10-CM | POA: Diagnosis not present

## 2016-12-09 DIAGNOSIS — O4402 Placenta previa specified as without hemorrhage, second trimester: Secondary | ICD-10-CM | POA: Diagnosis not present

## 2016-12-09 DIAGNOSIS — Z3A25 25 weeks gestation of pregnancy: Secondary | ICD-10-CM | POA: Diagnosis not present

## 2016-12-30 ENCOUNTER — Encounter (HOSPITAL_COMMUNITY): Payer: Self-pay

## 2016-12-30 DIAGNOSIS — Z3689 Encounter for other specified antenatal screening: Secondary | ICD-10-CM | POA: Diagnosis not present

## 2016-12-30 DIAGNOSIS — O4403 Placenta previa specified as without hemorrhage, third trimester: Secondary | ICD-10-CM | POA: Diagnosis not present

## 2016-12-30 DIAGNOSIS — Z3A28 28 weeks gestation of pregnancy: Secondary | ICD-10-CM | POA: Diagnosis not present

## 2016-12-30 DIAGNOSIS — O26893 Other specified pregnancy related conditions, third trimester: Secondary | ICD-10-CM | POA: Diagnosis not present

## 2016-12-30 LAB — OB RESULTS CONSOLE RPR: RPR: NONREACTIVE

## 2017-01-04 DIAGNOSIS — O99810 Abnormal glucose complicating pregnancy: Secondary | ICD-10-CM | POA: Diagnosis not present

## 2017-01-04 DIAGNOSIS — Z3A29 29 weeks gestation of pregnancy: Secondary | ICD-10-CM | POA: Diagnosis not present

## 2017-01-09 ENCOUNTER — Inpatient Hospital Stay (HOSPITAL_COMMUNITY)
Admission: AD | Admit: 2017-01-09 | Discharge: 2017-01-09 | Disposition: A | Discharge status: Home / Self Care | Payer: Medicaid Other | Source: Ambulatory Visit | Attending: Obstetrics and Gynecology | Admitting: Obstetrics and Gynecology

## 2017-01-09 ENCOUNTER — Encounter (HOSPITAL_COMMUNITY): Payer: Self-pay

## 2017-01-09 DIAGNOSIS — O98513 Other viral diseases complicating pregnancy, third trimester: Secondary | ICD-10-CM | POA: Diagnosis not present

## 2017-01-09 DIAGNOSIS — Z3A31 31 weeks gestation of pregnancy: Secondary | ICD-10-CM | POA: Insufficient documentation

## 2017-01-09 DIAGNOSIS — J069 Acute upper respiratory infection, unspecified: Secondary | ICD-10-CM | POA: Diagnosis not present

## 2017-01-09 DIAGNOSIS — O99513 Diseases of the respiratory system complicating pregnancy, third trimester: Secondary | ICD-10-CM | POA: Insufficient documentation

## 2017-01-09 DIAGNOSIS — N766 Ulceration of vulva: Secondary | ICD-10-CM | POA: Diagnosis not present

## 2017-01-09 LAB — INFLUENZA PANEL BY PCR (TYPE A & B)
INFLBPCR: NEGATIVE
Influenza A By PCR: NEGATIVE

## 2017-01-09 LAB — CBC WITH DIFFERENTIAL/PLATELET
BASOS ABS: 0 10*3/uL (ref 0.0–0.1)
BASOS PCT: 0 %
Eosinophils Absolute: 0.1 10*3/uL (ref 0.0–0.7)
Eosinophils Relative: 1 %
HEMATOCRIT: 34.9 % — AB (ref 36.0–46.0)
Hemoglobin: 11.9 g/dL — ABNORMAL LOW (ref 12.0–15.0)
Lymphocytes Relative: 19 %
Lymphs Abs: 2.5 10*3/uL (ref 0.7–4.0)
MCH: 29.2 pg (ref 26.0–34.0)
MCHC: 34.1 g/dL (ref 30.0–36.0)
MCV: 85.5 fL (ref 78.0–100.0)
MONO ABS: 0.4 10*3/uL (ref 0.1–1.0)
Monocytes Relative: 3 %
NEUTROS ABS: 10.3 10*3/uL — AB (ref 1.7–7.7)
NEUTROS PCT: 77 %
Platelets: 245 10*3/uL (ref 150–400)
RBC: 4.08 MIL/uL (ref 3.87–5.11)
RDW: 13.1 % (ref 11.5–15.5)
WBC: 13.4 10*3/uL — AB (ref 4.0–10.5)

## 2017-01-09 MED ORDER — ALBUTEROL SULFATE (2.5 MG/3ML) 0.083% IN NEBU
2.5000 mg | INHALATION_SOLUTION | Freq: Four times a day (QID) | RESPIRATORY_TRACT | Status: DC | PRN
  Administered 2017-01-09: 2.5 mg via RESPIRATORY_TRACT
  Filled 2017-01-09: qty 3

## 2017-01-09 MED ORDER — AZITHROMYCIN 250 MG PO TABS: ORAL_TABLET | ORAL | 0 refills | Status: DC

## 2017-01-09 MED ORDER — ALBUTEROL SULFATE (2.5 MG/3ML) 0.083% IN NEBU: 2.5000 mg | INHALATION_SOLUTION | Freq: Four times a day (QID) | RESPIRATORY_TRACT | 0 refills | Status: DC | PRN

## 2017-01-09 MED ORDER — HYDROCOD POLST-CPM POLST ER 10-8 MG/5ML PO SUER: 2.5000 mL | Freq: Two times a day (BID) | ORAL | 0 refills | Status: DC | PRN

## 2017-01-09 NOTE — Progress Notes (Addendum)
G1 @ 31.[redacted] wksga. Presents to triage for cold symptoms of coughing, chest pain, sob, and bloody sputum. States her mother has just been dx with pneumonia. Denies LOF or bleeding. EFM applied.   EKG being done by tech.   1712: Provider notified. Report status of pt given. Ordered for EKG.  1735: CNM from practice notified for confirmation if coming in to evaluate pt. Stated will evaluate pt since has a labor pt in birthing.   1800: Provider at bs assessing pt. Flu swab ordered.  Speculum exam done for c/o stringly mucus bleeding. No bleeding noted. Left labial noted reddened area/lesion. Culture done   Provider called. Ordered for nebulizer tx.  1837: Respiratory notified. Informed pt needs neb tx.   1907: checked on pt. Stated received neb tx and feeling much better. Provider notified and updated.   1940: provider at bs reassessing pt. Discharge orders received  1950: Discharge instructions given with pt understanding.  2005: Pt left unit via ambulatory with SO.

## 2017-01-09 NOTE — [facility] Provider Note (Signed)
History     CSN: [id number]  Arrival date and time: 01/09/17 1630   First Provider Initiated Contact with Patient 01/09/17 1657      Chief Complaint  Patient presents with  . Chest Pain    cold symptoms  . Shortness of Breath   HPI [name] [name] is a 21 yo G1P0 at 31+[redacted] weeks gestation presenting today with an upper respiratory infection.  Pt. c/o nasal congestion since Tuesday, and productive cough since Friday.  She reports coughing up green sputum, and clear drainage from her nose. She reports taking allergy medication, Robitussin, and Tylenol PRN without relief.  She has not taken her temperature, but felt like she has had fever/chills/body aches.  She reports shortness of breath and wheezing. She reports her mother was diagnosed with pneumonia this week, and she has been around her mother before she received treatment.  She denies tobacco use, but states her husband does smoke in the house.  She also reports on arrival to [facility], she saw a "string of pink-tinged mucus" when she wiped.  Her pregnancy is complicated by: vasa previa, abnormal 1 hour GTT, but normal 3 hr GTT.   OB History    Gravida Para Term Preterm AB Living   1             SAB TAB Ectopic Multiple Live Births                  History reviewed. No pertinent past medical history.  Past Surgical History:  Procedure Laterality Date  . NO PAST SURGERIES      Family History  Problem Relation Age of Onset  . Heart disease Mother   . Hypertension Mother 94       PULMONARY ARTERIAL HYPERTENSION    Social History  Substance Use Topics  . Smoking status: Never Smoker  . Smokeless tobacco: Never Used  . Alcohol use No    Allergies: No Known Allergies  Prescriptions Prior to Admission  Medication Sig Dispense Refill Last Dose  . Doxylamine-Pyridoxine 10-10 MG TBEC Take 1 tablet by mouth 2 (two) times daily. 60 tablet 1   . metoCLOPramide (REGLAN) 10 MG tablet Take 1 tablet (10 mg total) by mouth 3 (three) times  daily with meals. 30 tablet 1 Taking  . Pediatric Multivit-Minerals-C (FLINTSTONES GUMMIES COMPLETE PO) Take 2 each by mouth daily.   Taking    Review of Systems  Constitutional: Positive for chills, fatigue and fever.  HENT: Positive for congestion, sinus pain, sinus pressure and sore throat.   Respiratory: Positive for cough, chest tightness and wheezing.   Cardiovascular: Positive for chest pain.  Gastrointestinal: Positive for nausea.  Genitourinary: Positive for vaginal bleeding.       Pink tinged mucus  Musculoskeletal: Positive for myalgias.  Neurological: Negative.   Psychiatric/Behavioral: Negative.    Physical Exam   Blood pressure 127/72, pulse (!) 116, temperature 98.4 F (36.9 C), temperature source Oral, resp. rate 18, height  (1.626 m), weight 92.5 kg (204 lb), last menstrual period 07/11/2016, SpO2 96 %.  Physical Exam  Constitutional: She is oriented to person, place, and time. She appears well-developed and well-nourished.  HENT:  Mouth/Throat: Oropharyngeal exudate present.  Neck: Normal range of motion. Neck supple.  Cardiovascular: Regular rhythm and normal heart sounds.   Sinus tachycardia noted  Respiratory: Breath sounds normal. No respiratory distress. She has no wheezes.  GI: Bowel sounds are normal.  Genitourinary:  Genitourinary Comments: Sterile speculum exam: normal, white  vaginal discharge noted. No evidence of bleeding from the cervix. Left labia minora: small ulcerated lesion, friable to touch with Q-tip swab, tender, small amount of blood noted from ulcer.  HSV PCR collected.   Neurological: She is alert and oriented to person, place, and time.  Skin: Skin is warm and dry.   Fetal monitoring: Baseline: 150 bpm/ moderate variability/ +15x15 accels/ no decels Toco: no contractions noted   EKG: sinus tachycardia  MAU Course  Procedures   Assessment and Plan  1. Single IUP at 31+3 weeks with upper respiratory infection      - CBC with  differential      - Influenza PCR     - Albuterol nebulizer treatment     - Oral hydration     - Droplet precautions  2. Left labia minora ulcer     - HSV PCR 3. Vasa previa      - No evidence of bleeding in the vagina or from cervix     - Defer ultrasound until scheduled sono on Tuesday 4. Category 1 Fetal Tracing   Karena Addison 01/09/2017, 5:59 PM   Reassessment at 1930: - Pt. Feeling much better after nebulizer treatment  - Begin Z-pack - printed rx given  - Albuterol inhaler PRN for wheezing - printed rx given  - Tussionex PRN for cough - printed rx given  - Warning s/s and precautions reviewed and notified to return with high fevers, worsening SOB or wheezing - Continue pelvic rest  - Bleeding precautions given - Preterm labor precautions reviewed - FKC's daily   Consult for plan of care: Dr. Roosvelt Maser, [facility]  [facility]

## 2017-01-10 LAB — HERPES SIMPLEX VIRUS(HSV) DNA BY PCR
HSV 1 DNA: NEGATIVE
HSV 2 DNA: NEGATIVE

## 2017-01-11 ENCOUNTER — Other Ambulatory Visit (HOSPITAL_COMMUNITY): Payer: Self-pay

## 2017-01-11 DIAGNOSIS — Z3A30 30 weeks gestation of pregnancy: Secondary | ICD-10-CM | POA: Diagnosis not present

## 2017-01-12 ENCOUNTER — Encounter (HOSPITAL_COMMUNITY): Payer: Self-pay

## 2017-01-12 ENCOUNTER — Other Ambulatory Visit (HOSPITAL_COMMUNITY): Payer: Self-pay | Admitting: Obstetrics and Gynecology

## 2017-01-12 DIAGNOSIS — Z3A30 30 weeks gestation of pregnancy: Secondary | ICD-10-CM | POA: Diagnosis not present

## 2017-01-13 ENCOUNTER — Ambulatory Visit (HOSPITAL_COMMUNITY)
Admission: RE | Admit: 2017-01-13 | Discharge: 2017-01-13 | Disposition: A | Discharge status: Home / Self Care | Payer: Medicaid Other | Source: Ambulatory Visit | Attending: Obstetrics and Gynecology | Admitting: Obstetrics and Gynecology

## 2017-01-13 ENCOUNTER — Other Ambulatory Visit (HOSPITAL_COMMUNITY): Payer: Self-pay | Admitting: Obstetrics and Gynecology

## 2017-01-13 ENCOUNTER — Encounter (HOSPITAL_COMMUNITY): Payer: Self-pay

## 2017-01-13 DIAGNOSIS — O99213 Obesity complicating pregnancy, third trimester: Secondary | ICD-10-CM | POA: Diagnosis not present

## 2017-01-13 DIAGNOSIS — Z3689 Encounter for other specified antenatal screening: Secondary | ICD-10-CM | POA: Insufficient documentation

## 2017-01-13 DIAGNOSIS — Z3A30 30 weeks gestation of pregnancy: Secondary | ICD-10-CM | POA: Insufficient documentation

## 2017-01-13 HISTORY — DX: Other specified health status: Z78.9

## 2017-01-13 NOTE — Addendum Note (Signed)
Encounter addended by: Allegra Lai, MD on: 01/13/2017 11:10 AM<BR>    Actions taken: Charge Capture section accepted

## 2017-01-13 NOTE — Consult Note (Signed)
Maternal Fetal Medicine Consultation  Requesting Provider(s): Taavon   Primary OB: [facility] Reason for consultation: Suspected vasa previa  HPI: 21yo P0 now at 30+4 weeks who underwent US last week and was felt to have a vasa previa. She had a placenta previa that resolved, placing her at increased risk for vasa previa. She has had no significant bleeding during the pregnancy. 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 She currently reports no uterine contractions OB History: OB History    Gravida Para Term Preterm AB Living   1         0   SAB TAB Ectopic Multiple Live Births                  PMH:  Past Medical History:  Diagnosis Date  . Medical history non-contributory     PSH:  Past Surgical History:  Procedure Laterality Date  . NO PAST SURGERIES     Meds: PNV Allergies: NKDA FH: See EPIC section Soc: See EPIC section  Review of Systems: no vaginal bleeding or cramping/contractions, no LOF, no nausea/vomiting. All other systems reviewed and are negative.  PE:  VS: See EPIC section GEN: well-appearing female ABD: gravid, NT  Please see separate document for fetal ultrasound report.  A/P: Probable vasa previa The [place] today is very high risk for vasa previa. We are able to identify fetal vessels crossing very near if not across the cervical internal os. She lives in [place] and is thus not a candidate for outpatient management. We would recommend admission as an inpatient at 31-32 weeks. Prior to admission she should undergo outpatient 1 hour NST twice weekly looking for evidence of FHR variable decelerations. If these are repetitive then earlier admission is advised. Once admitted, she should undergo 1 hour NSTs three times daily. Repetitive variables on this would be an indication for continuous monitoring, and iff this is confirmed, proceeding with delivery. Otherwise, delivery should be done between 34 and 35 weeks. Repeat betamethasone should be  performed 48 hours prior to scheduled cesarean delivery  Thank you for the opportunity to be a part of the care of [employer]. Please contact our office if we can be of further assistance.   I spent approximately 30 minutes with this patient with over 50% of time spent in face-to-face counseling.

## 2017-01-14 DIAGNOSIS — Z3A30 30 weeks gestation of pregnancy: Secondary | ICD-10-CM | POA: Diagnosis not present

## 2017-01-17 DIAGNOSIS — Z3A31 31 weeks gestation of pregnancy: Secondary | ICD-10-CM | POA: Diagnosis not present

## 2017-01-17 DIAGNOSIS — O26893 Other specified pregnancy related conditions, third trimester: Secondary | ICD-10-CM | POA: Diagnosis not present

## 2017-01-23 ENCOUNTER — Other Ambulatory Visit: Payer: Self-pay | Admitting: Obstetrics and Gynecology

## 2017-01-23 ENCOUNTER — Encounter (HOSPITAL_COMMUNITY): Payer: Self-pay | Admitting: *Deleted

## 2017-01-23 ENCOUNTER — Inpatient Hospital Stay (HOSPITAL_COMMUNITY)
Admission: AD | Admit: 2017-01-23 | Discharge: 2017-02-14 | DRG: 786 | Disposition: A | Discharge status: Home / Self Care | Payer: Medicaid Other | Source: Ambulatory Visit | Attending: Obstetrics and Gynecology | Admitting: Obstetrics and Gynecology

## 2017-01-23 DIAGNOSIS — O43123 Velamentous insertion of umbilical cord, third trimester: Secondary | ICD-10-CM | POA: Diagnosis not present

## 2017-01-23 DIAGNOSIS — O9989 Other specified diseases and conditions complicating pregnancy, childbirth and the puerperium: Secondary | ICD-10-CM

## 2017-01-23 DIAGNOSIS — Z3A33 33 weeks gestation of pregnancy: Secondary | ICD-10-CM

## 2017-01-23 DIAGNOSIS — O9081 Anemia of the puerperium: Secondary | ICD-10-CM | POA: Diagnosis not present

## 2017-01-23 DIAGNOSIS — Z283 Underimmunization status: Secondary | ICD-10-CM

## 2017-01-23 DIAGNOSIS — O09899 Supervision of other high risk pregnancies, unspecified trimester: Secondary | ICD-10-CM

## 2017-01-23 DIAGNOSIS — D62 Acute posthemorrhagic anemia: Secondary | ICD-10-CM | POA: Diagnosis not present

## 2017-01-23 DIAGNOSIS — O43193 Other malformation of placenta, third trimester: Secondary | ICD-10-CM | POA: Diagnosis not present

## 2017-01-23 DIAGNOSIS — Z3A34 34 weeks gestation of pregnancy: Secondary | ICD-10-CM | POA: Diagnosis not present

## 2017-01-23 DIAGNOSIS — Z3A32 32 weeks gestation of pregnancy: Secondary | ICD-10-CM

## 2017-01-23 DIAGNOSIS — Z2839 Other underimmunization status: Secondary | ICD-10-CM

## 2017-01-23 LAB — CBC
HCT: 36.3 % (ref 36.0–46.0)
Hemoglobin: 12.2 g/dL (ref 12.0–15.0)
MCH: 28.8 pg (ref 26.0–34.0)
MCHC: 33.6 g/dL (ref 30.0–36.0)
MCV: 85.6 fL (ref 78.0–100.0)
PLATELETS: 285 10*3/uL (ref 150–400)
RBC: 4.24 MIL/uL (ref 3.87–5.11)
RDW: 13 % (ref 11.5–15.5)
WBC: 13.2 10*3/uL — ABNORMAL HIGH (ref 4.0–10.5)

## 2017-01-23 LAB — TYPE AND SCREEN
ABO/RH(D): A POS
ANTIBODY SCREEN: NEGATIVE

## 2017-01-23 MED ORDER — NIFEDIPINE ER OSMOTIC RELEASE 30 MG PO TB24
30.0000 mg | ORAL_TABLET | Freq: Two times a day (BID) | ORAL | Status: DC
  Administered 2017-01-23 – 2017-01-26 (×8): 30 mg via ORAL
  Filled 2017-01-23 (×8): qty 1

## 2017-01-23 MED ORDER — DOCUSATE SODIUM 100 MG PO CAPS
100.0000 mg | ORAL_CAPSULE | Freq: Every day | ORAL | Status: DC
  Administered 2017-01-23 – 2017-02-13 (×21): 100 mg via ORAL
  Filled 2017-01-23 (×23): qty 1

## 2017-01-23 MED ORDER — PRENATAL MULTIVITAMIN CH
1.0000 | ORAL_TABLET | Freq: Every day | ORAL | Status: DC
  Administered 2017-01-23 – 2017-02-10 (×19): 1 via ORAL
  Filled 2017-01-23 (×19): qty 1

## 2017-01-23 MED ORDER — CALCIUM CARBONATE ANTACID 500 MG PO CHEW: 2.0000 | CHEWABLE_TABLET | ORAL | Status: DC | PRN

## 2017-01-23 MED ORDER — ZOLPIDEM TARTRATE 5 MG PO TABS: 5.0000 mg | ORAL_TABLET | Freq: Every evening | ORAL | Status: DC | PRN

## 2017-01-23 MED ORDER — ACETAMINOPHEN 325 MG PO TABS
650.0000 mg | ORAL_TABLET | ORAL | Status: DC | PRN
  Administered 2017-01-31 – 2017-02-10 (×3): 650 mg via ORAL
  Filled 2017-01-23 (×3): qty 2

## 2017-01-23 NOTE — H&P (Signed)
NAMVickii [name]:  [name] [name]                 ACCOUNT NO.:  [id number]  MEDICAL RECORD NO.:  [id number]  LOCATION:  9315                          FACILITY:  [facility]  PHYSICIAN:  [name] [name], M.D.DATE OF BIRTH:  25-Jan-1996  DATE OF ADMISSION:  01/23/2017 DATE OF DISCHARGE:                             HISTORY & PHYSICAL   ADMISSION DIAGNOSIS:  Vasa previa.  HISTORY OF PRESENT ILLNESS:  A 21 year old white female G1, P0 at 6632 weeks' gestation; presenting for inpatient admission due to known vasa previa.  She reports occasional contractions.  Currently taking Procardia XL b.i.d. with marked decrease in contraction frequency.  She reports good fetal movement.  No bleeding or leakage of fluid.  ALLERGIES:  She has no known drug allergies.  MEDICATIONS:  Procardia and prenatal vitamins.  FAMILY HISTORY:  Heart disease and PE.  SOCIAL HISTORY:  She is nonsmoker, nondrinker.  She denies domestic or physical violence.  MEDICAL/SURGICAL HISTORY:  She has a nonsurgical medical history, no previous hospitalizations.  Prenatal course otherwise uncomplicated.  DATA:  Most recent ultrasound done revealed a visualized vasa previa which was confirmed by MFM for a posterior placenta.  Appropriately grown fetus with normal amniotic fluid index was previously confirmed on ultrasound performed on December 30, 2016.  At that time, estimated fetal weight was noted at 3 pounds and 2 ounces with an AFI of 16.2.  PHYSICAL EXAMINATION:  GENERAL:  She is a well-developed, well-nourished white female in no acute distress. HEENT:  Normal. NECK:  Supple.  Full range of motion. LUNGS:  Clear. HEART:  Regular rate and rhythm. ABDOMEN:  Soft, gravid, nontender.  No CVA tenderness. EXTREMITIES:  There are no cords. NEUROLOGIC:  Nonfocal. SKIN:  Intact. PELVIC:  Deferred.  IMPRESSION: 1. 32-week intrauterine pregnancy. 2. Vasa previa confirmed by Maternal-Fetal Medicine sonogram.     Posterior placenta  noted. 3. Preterm contractions.  PLAN:  Admit, monitor with NST monitoring 1 hour 3 times a day.  MFM followup ultrasound for growth in within 1 week.  Delivery at 34-35 weeks or with fetal or maternal indications as noted.     [name] Adenichard J. Tallis Soledad, M.D.     RJT/MEDQ  D:  01/23/2017  T:  01/23/2017  Job:  [id number]

## 2017-01-23 NOTE — Progress Notes (Signed)
Patient ID: [name] [name], female   DOB: 04/09/1995, 21 y.o.   MRN: [id number] Known Vasa Previa for admission  Good FM. Occ contractions. No bleeding. Taking nifedipine xl bid at home with marked improvement. BP 133/68 (BP Location: Right Arm)   Pulse (!) 101   Temp 98.6 F (37 C) (Oral)   Resp 16   Ht 5\' 4"  (1.626 m)   Wt 93 kg (205 lb)   LMP 06/13/2016   SpO2 98%   BMI 35.19 kg/m   FHR 145, BTBV 5-25, no decels, 15x15 accels, irregular contractions Category 1 tracing. TID monitoring.

## 2017-01-24 NOTE — Progress Notes (Signed)
Patient ID: [name] [name], female   DOB: 10/21/1995, 21 y.o.   MRN: [id number] HD #2 32w 1d Vasa Previa  S: No complaints. Good FM. Rare contractions. No bleeding. No CP or SOB  O: BP 122/67 (BP Location: Right Arm)   Pulse 91   Temp 98.3 F (36.8 C) (Oral)   Resp 16   Ht 5\' 4"  (1.626 m)   Wt 93 kg (205 lb)   LMP 06/13/2016   SpO2 99%   BMI 35.19 kg/m   HEENT: nl Neck: supple with FROM Lungs: CTA CV : RRR ABD: Gravid, NT NO CVAT EXT: neg cords, SCDs  Neuro: non focal Skin: intact  FHR 135-145, BTBV 5-25, 15x15accels, no decels, rare contractions Category 1 tracing x 3   NL growth sono with MFM on 1/11 1774gms, nl AFI  A: 32 w 1d IUP Vasa Previa Preterm contractions on Nifedipine  P: NST x one hr tid MFM fu Nifedipine XL bid Primary csection 34-35 weeks. INPT management until delivery

## 2017-01-25 NOTE — Progress Notes (Signed)
Patient ID: [name] [name], female   DOB: 08/19/1995, 21 y.o.   MRN: [id number] HD #3 32w 2d Vasa Previa  S: No complaints. Good FM. Rare contractions. No bleeding. No CP or SOB  O: BP 132/64 (BP Location: Right Arm)   Pulse 87   Temp 98.9 F (37.2 C) (Oral)   Resp 17   Ht 5\' 4"  (1.626 m)   Wt 93 kg (205 lb)   LMP 06/13/2016   SpO2 100%   BMI 35.19 kg/m   HEENT: nl Neck: supple with FROM Lungs: CTA CV : RRR ABD: Gravid, NT NO CVAT EXT: neg cords, SCDs  Neuro: non focal Skin: intact  FHR 135-145, BTBV 5-25, 15x15accels, no decels, rare contractions Category 1 tracing x 3   NL growth sono with MFM on 10/11 1774gms, nl AFI  A: 32 w 2d IUP Vasa Previa Preterm contractions on Nifedipine  P: NST x one hr tid MFM fu Nifedipine XL bid Primary csection 34-35 weeks. INPT management until delivery

## 2017-01-26 LAB — TYPE AND SCREEN
ABO/RH(D): A POS
ANTIBODY SCREEN: NEGATIVE

## 2017-01-26 NOTE — Progress Notes (Signed)
Patient ID: [name] [name], female   DOB: 11/21/1995, 21 y.o.   MRN: [id number] HD #4 32w 3d Vasa Previa  S: No complaints. Good FM. Rare contractions. No bleeding. No CP or SOB. No headaches or visual changes. No epigastric pain   O: BP 140/69 (BP Location: Right Arm)   Pulse 91   Temp 98.3 F (36.8 C) (Oral)   Resp 18   Ht 5\' 4"  (1.626 m)   Wt 93 kg (205 lb)   LMP 06/13/2016   SpO2 100%   BMI 35.19 kg/m   HEENT: nl Neck: supple with FROM Lungs: CTA CV : RRR ABD: Gravid, NT NO CVAT EXT: neg cords, SCDs  Neuro: non focal Skin: intact  FHR 145-155, BTBV 5-25, 15x15accels, no decels, rare contractions Category 1 tracing x 3   NL growth sono with MFM on 10/11 1774gms, nl AFI  A: 32 w 3d IUP Vasa Previa Preterm contractions on Nifedipine Occ systolic BP elevation  P: Monitor BP, s/s PEC NST x one hr tid MFM fu Nifedipine XL bid Primary csection 34-35 weeks. INPT management until delivery

## 2017-01-27 ENCOUNTER — Encounter (HOSPITAL_COMMUNITY): Payer: Self-pay | Admitting: *Deleted

## 2017-01-27 NOTE — Progress Notes (Signed)
Patient ID: Marquita Palmsemi R Umble, female   DOB: 11/14/1995, 21 y.o.   MRN: [id number] HD #5 32w 4d Vasa Previa  S: No complaints. Good FM. Rare contractions. No bleeding. No CP or SOB. No headaches or visual changes. No epigastric pain   O: BP (!) 126/58 (BP Location: Right Arm)   Pulse 85   Temp 99.4 F (37.4 C) (Oral)   Resp 16   Ht 5\' 4"  (1.626 m)   Wt 93 kg (205 lb)   LMP 06/13/2016   SpO2 97%   BMI 35.19 kg/m   HEENT: nl Neck: supple with FROM Lungs: CTA CV : RRR ABD: Gravid, NT NO CVAT EXT: neg cords, SCDs  Neuro: non focal Skin: intact  FHR 145-155, BTBV 5-25, 15x15accels, no decels, rare contractions, rare mild variable decel Category 1 tracing x 3   NL growth sono with MFM on 10/11 1774gms, nl AFI  A: 32 w 4d IUP Vasa Previa Preterm contractions resolved Occ systolic BP elevation  P: Monitor BP, s/s PEC NST x one hr tid MFM fu for growth 11/1 Nifedipine XL bid- will dc, no contractions noted Primary csection 34-35 weeks. INPT management until delivery

## 2017-01-28 NOTE — Progress Notes (Signed)
Patient ID: Marquita Palmsemi R Onofre, female   DOB: 10/12/1995, 21 y.o.   MRN: [id number] HD #6 32w 5d Vasa Previa  S: No complaints. Good FM. No contractions. No longer on Procardia. No bleeding. No CP or SOB. No headaches or visual changes. No epigastric pain  Enjoying wheelchair rides  O: BP 120/69 (BP Location: Right Arm)   Pulse 79   Temp 98.1 F (36.7 C) (Oral)   Resp 18   Ht 5\' 4"  (1.626 m)   Wt 93 kg (205 lb)   LMP 06/13/2016   SpO2 98%   BMI 35.19 kg/m   ABD: Gravid, NT EXT: neg cords, SCDs  Neuro: non focal Skin: intact  FHR 140s, moderate variability. + accels, no decels,  Toco: rare contractions,    NL growth sono with MFM on 10/11 1774gms, nl AFI  A: 32 w 5d IUP Vasa Previa Preterm contractions resolved   P: Vasa Previa, in house to monitor for bleeding, allow [facility] x one hr tid MFM fu for growth 11/1 Nifedipine now prn contractions Primary csection 34-35 weeks. INPT management until delivery  Taliana Mersereau A. 01/28/2017 1:25 PM

## 2017-01-28 NOTE — Progress Notes (Signed)
Initial Nutrition Assessment  DOCUMENTATION CODES:   Obesity unspecified  INTERVENTION:  Regular diet May order double protein portions, snacks TID and from retail NUTRITION DIAGNOSIS:  Increased nutrient needs related to  (pregnancy and fetal growth requirements) as evidenced by  (32 weeks IUP).  GOAL:  Patient will meet greater than or equal to 90% of their needs  MONITOR:  Weight trends  REASON FOR ASSESSMENT:  Antenatal   ASSESSMENT:  32 5/7 weeks, vasa previa. pre-preg weight 189 lbs, BMI 32.5. 16 lb eight gain. reports good appetite  Diet Order:  Diet regular Room service appropriate? Yes; Fluid consistency: Thin  EDUCATION NEEDS:   No education needs have been identified at this time  Skin:  Skin Assessment: Reviewed RN Assessment  Height:   Ht Readings from Last 1 Encounters:  01/23/17 5\' 4"  (1.626 m)    Weight:   Wt Readings from Last 1 Encounters:  01/23/17 205 lb (93 kg)    Ideal Body Weight:     BMI:  Body mass index is 35.19 kg/m.  Estimated Nutritional Needs:   Kcal:  2000- 2200  Protein:  90-100 g  Fluid:  2.3 L    Elisabeth CaraKatherine Rylynn Schoneman M.Odis LusterEd. R.D. LDN Neonatal Nutrition Support Specialist/RD III Pager [phone]      Phone [phone]

## 2017-01-29 LAB — TYPE AND SCREEN
ABO/RH(D): A POS
ANTIBODY SCREEN: NEGATIVE

## 2017-01-29 NOTE — Plan of Care (Signed)
Problem: Physical Regulation: Goal: Complications related to the disease process, condition or treatment will be avoided or minimized Outcome: Progressing No vaginal bleeding at this time.

## 2017-01-29 NOTE — Progress Notes (Signed)
Patient ID: [name] [name], female   DOB: 08/12/1995, [age] y.o.   MRN: [id number] HD #7 32w 6d Vasa Previa  S: No complaints. Good FM. Mild increase in  Contractions last night, now only occasional ctx. No longer on Procardia. No bleeding and good FM, even with the contractions. Did not get prn procardia.  No CP or SOB. No headaches or visual changes. No epigastric pain  Enjoying wheelchair rides  O: BP (!) 114/57 (BP Location: Right Arm)   Pulse 77   Temp 98.5 F (36.9 C) (Oral)   Resp 16   Ht 5\' 4"  (1.626 m)   Wt 93 kg (205 lb)   LMP 06/13/2016   SpO2 98%   BMI 35.19 kg/m   ABD: Gravid, NT EXT: neg cords, SCDs  Neuro: non focal Skin: intact Gen: sleeping  FHR 140s, moderate variability. + accels, no decels,  Toco: rare contractions,    NL growth sono with MFM on 10/11 1774gms, nl AFI  A: 32 w 6d IUP Vasa Previa Intermittent contractions   P: Vasa Previa, in house to monitor for bleeding, [facility] x one hr tid MFM fu for growth 11/1 Nifedipine now prn contractions Primary csection 34-35 weeks. INPT management until delivery  Savaya Hakes A. 01/29/2017 11:02 AM

## 2017-01-30 NOTE — Progress Notes (Signed)
Patient ID: [name] [name], female   DOB: 08/17/1995, 21 y.o.   MRN: [id number] HD #8 33w 0d Vasa Previa  S: No complaints. Good FM. No further contractions. Has not needed prn procardia. No bleeding and good FM. No complaints. O: BP (!) 115/56 (BP Location: Right Arm)   Pulse 76   Temp 98 F (36.7 C) (Oral)   Resp 18   Ht 5\' 4"  (1.626 m)   Wt 205 lb (93 kg)   LMP 06/13/2016   SpO2 99%   BMI 35.19 kg/m   ABD: Gravid, NT EXT: neg cords, SCDs  Neuro: non focal Skin: intact Gen: sleeping CV RRR Pulm CTAB  FHR 140s, moderate variability. + accels, no decels,  Toco: no contractions   NL growth sono with MFM on 10/11 1774gms, nl AFI  A: 33w 0d IUP Vasa Previa    P: Vasa Previa, in house to monitor for bleeding, allow [facility] N[facility] x one hr tid MFM fu for growth 11/1 Nifedipine now prn contractions Primary csection 34-35 weeks. INPT management until delivery  Farley Crooker A. 01/30/2017 11:24 AM

## 2017-01-31 NOTE — Progress Notes (Signed)
HD #8 33.1 wga Vasa previa  No c/o; denies sob/cp; tol po; no ctx/vb/lof; +FM; has noted increased FM last night especially and noted abd pain with movement  Temp:  [97.3 F (36.3 C)-99 F (37.2 C)] 97.3 F (36.3 C) (10/29 0750) Pulse Rate:  [80-90] 80 (10/29 0750) Resp:  [17-18] 17 (10/29 0750) BP: (113-132)/(59-73) 113/59 (10/29 0750) SpO2:  [98 %-100 %] 99 % (10/29 0750)  A&Ox3 rrr ctab Abd: soft, nt, gravid LE: no edema, NT bilat  FHT: NST x3: baseline ranges from 130s-150s, nml variability; +accels, no decels noted; 1 small variable about 24 hrs ago, none further TOCO: rare ctx   A/P: iup at 33.1wga with vasa previa 1. Stable - contin to follow for vb, ctx; nifedipine prn ctx 2. Fetal status reassuring - contin nst for an hr tid 3. Growth u/s pending 11/1, 10/11: 1774gms, nml afi 4. Plan 1ltcs 34-35wga, contin inpt management

## 2017-01-31 NOTE — Progress Notes (Signed)
Fetus very active hard to keep on fetal monitor. Pt reports pain better will be able to sleep. No leaking or bleeding peri pad on

## 2017-02-01 NOTE — Progress Notes (Signed)
Patient ID: [name] [name], female   DOB: 04/27/1995, 21 y.o.   MRN: [id number] HD #9 33w 2d Vasa Previa  S: No complaints. Good FM. Rare contractions. No bleeding. No CP or SOB. No headaches or visual changes. No epigastric pain   O: BP 124/70 (BP Location: Right Arm)   Pulse 76   Temp 98.6 F (37 C) (Oral)   Resp 15   Ht 5\' 4"  (1.626 m)   Wt 93 kg (205 lb)   LMP 06/13/2016   SpO2 100%   BMI 35.19 kg/m   HEENT: nl Neck: supple with FROM Lungs: CTA CV : RRR ABD: Gravid, NT NO CVAT EXT: neg cords, SCDs  Neuro: non focal Skin: intact  FHR 145-150, BTBV 5-25, 15x15accels, no decels, rare contractions Category 1 tracing x 3   NL growth sono with MFM on 10/11 1774gms, nl AFI  A: 33 w 2d IUP Vasa Previa Preterm contractions resolved  P: Monitor BP NST x one hr tid MFM fu for growth 11/1 Nifedipine XL bid prn Primary csection 34-35 weeks. INPT management until delivery

## 2017-02-02 NOTE — Progress Notes (Signed)
Patient ID: [name] [name], female   DOB: 06/05/1995, 21 y.o.   MRN: [id number] HD 10 33w 3d Vasa Previa  S: No complaints. Good FM. Rare contractions. No bleeding. No CP or SOB. No headaches or visual changes. No epigastric pain   O: BP (!) 120/57 (BP Location: Right Arm)   Pulse 65   Temp 98.3 F (36.8 C) (Oral)   Resp 16   Ht 5\' 4"  (1.626 m)   Wt 93 kg (205 lb)   LMP 06/13/2016   SpO2 98%   BMI 35.19 kg/m   HEENT: nl Neck: supple with FROM Lungs: CTA CV : RRR ABD: Gravid, NT NO CVAT EXT: neg cords, SCDs  Neuro: non focal Skin: intact  FHR 145-150, BTBV 5-25, 15x15accels, no decels, rare contractions Category 1 tracing x 3   NL growth sono with MFM on 10/11 1774gms, nl AFI  A: 33 w 3d IUP Vasa Previa Preterm contractions resolved  P: Monitor BP NST x one hr tid MFM fu for growth 11/1 Nifedipine XL bid prn Primary csection 34-35 weeks. INPT management until delivery

## 2017-02-03 ENCOUNTER — Inpatient Hospital Stay (HOSPITAL_COMMUNITY): Payer: Medicaid Other

## 2017-02-03 NOTE — Consult Note (Signed)
Neonatology Consult to Antenatal Patient:  I was asked by Dr. Billy Coastaavon to see this patient in order to provide antenatal counseling due to 33 4/7 week IUP.  Mrs. [name] [name] was admitted 01/23/2017 at [redacted] weeks GA for concerns over vasa previa. She is a G1 currently at 33 4/7 weeks and awaiting delivery in the near future between 34 and 35 wks.  She is s/p BTMZ and procardia.  Growth appropriate.  I spoke with the patient patient, father, and pGPs. We discussed the worst case of delivery in the next 1-2 days, including usual DR management, possible respiratory complications and need for support, IV access, feedings (mother desires breast feeding, which was encouraged), LOS, Mortality and Morbidity, and long term outcomes. I answered their questions and gave them a March of Dimes handout on prematurity. I would be glad to come back if she has more questions later.  Thank you for asking me to see this patient.  [name] Brookesavid Ehrmann, MD Neonatologist  The total length of face-to-face or floor/unit time for this encounter was 45 minutes. Counseling and/or coordination of care was 25 minutes of the above.

## 2017-02-03 NOTE — Progress Notes (Signed)
Patient seen again for repeat scan. She has an ultrasound picture at very high risk for vasa previa. She is an inpatient and is receiving care as per my recommendations from my consult on 01/13/2017 Today's scan confirms fetal vessels in a position that makes this patient at very high risk for adverse outcomes from labor or rupture of membranes. As previously recommended, she should be delivered by C/S between 34+0 and 34+6 weeks. I understand that she has already received a previous course of betamethasone and a "booster" dose is planned. That should be given so that delivery occurs in the 24 to 48 hours post-dose window. Until delivery TID monitoring should be performed as previously recommended. Thank you for the opportunity to be a part of the care of [name] [name]. Please contact our office if we can be of further assistance.   I spent approximately 10 minutes with this patient with over 50% of time spent in face-to-face counseling.

## 2017-02-03 NOTE — Progress Notes (Signed)
Patient ID: [name] [name], female   DOB: 03/16/1996, 21 y.o.   MRN: [id number] HD 11 33w 4d Vasa Previa  S: No complaints. Good FM. Rare contractions. No bleeding. No CP or SOB. No headaches or visual changes. No epigastric pain   O: BP (!) 124/58 (BP Location: Right Arm)   Pulse 82   Temp 98.9 F (37.2 C) (Oral)   Resp 18   Ht 5\' 4"  (1.626 m)   Wt 93 kg (205 lb)   LMP 06/13/2016   SpO2 98%   BMI 35.19 kg/m   HEENT: nl Neck: supple with FROM Lungs: CTA CV : RRR ABD: Gravid, NT NO CVAT EXT: neg cords, SCDs  Neuro: non focal Skin: intact  FHR 145-150, BTBV 5-25, 15x15accels, no decels, rare contractions Category 1 tracing x 3   NL growth sono with MFM on 10/11 1774gms, nl AFI  A: 33 w 4d IUP Vasa Previa Preterm contractions resolved  P: Monitor BP NST x one hr tid MFM fu for growth today Nifedipine XL bid prn Primary csection 34-35 weeks. INPT management until delivery

## 2017-02-03 NOTE — Progress Notes (Signed)
Addendum to today's note: would recommend NICU consult today so patient will know what to expect for her baby's postnatal care

## 2017-02-04 LAB — TYPE AND SCREEN
ABO/RH(D): A POS
ABO/RH(D): A POS
Antibody Screen: NEGATIVE
Antibody Screen: NEGATIVE

## 2017-02-04 NOTE — Progress Notes (Signed)
Patient ID: Marquita Palmsemi R [place], female   DOB: 08/16/1995, 21 y.o.   MRN: [id number] HD 12 33w 5d Vasa Previa  S: No complaints. Good FM. Rare contractions. No bleeding. No CP or SOB. No headaches or visual changes. No epigastric pain   O: BP 140/74 (BP Location: Right Arm)   Pulse 68   Temp 98.6 F (37 C) (Oral)   Resp 18   Ht 5\' 4"  (1.626 m)   Wt 93 kg (205 lb)   LMP 06/13/2016   SpO2 99%   BMI 35.19 kg/m   HEENT: nl Neck: supple with FROM Lungs: CTA CV : RRR ABD: Gravid, NT NO CVAT EXT: neg cords, SCDs  Neuro: non focal Skin: intact  FHR 145-150, BTBV 5-25, 15x15accels, no decels, rare contractions Category 1 tracing x 3   NL growth sono with MFM on 11/1 2399gms, nl AFI Vasa previa confirmed  A: 33 w 5d IUP Vasa Previa Preterm contractions resolved  P: Monitor BP NST x one hr tid MFM fu for growth done Nifedipine XL bid prn Primary csection 34-35 weeks. INPT management until delivery

## 2017-02-05 NOTE — Progress Notes (Signed)
Pt awoken from sleep c/o vaginal bleeding.   Pad moderate amt of dark red blood with pink around old blood.   Pt to BR to change pad than back to bed.

## 2017-02-05 NOTE — Progress Notes (Signed)
Patient ID: [name] [name], female   DOB: 04/20/1995, 21 y.o.   MRN: [id number] HD 13 33w 6d Vasa Previa  S: No complaints. Good FM. Rare contractions. No bleeding. No CP or SOB. Anxious to deliver. No headaches or visual changes. No epigastric pain   O: BP (!) 127/59 (BP Location: Right Arm) Comment: notified nurse  Pulse 76   Temp 98.3 F (36.8 C) (Oral)   Resp 17   Ht 5\' 4"  (1.626 m)   Wt 93 kg (205 lb)   LMP 06/13/2016   SpO2 99%   BMI 35.19 kg/m   HEENT: nl Neck: supple with FROM Lungs: CTA CV : RRR ABD: Gravid, NT NO CVAT EXT: neg cords, SCDs  Neuro: non focal Skin: intact  FHR 145-150, BTBV 5-25, 15x15accels, rare variable decel with contraction, rare contractions Category 1 tracing x 3   NL growth sono with MFM on 11/1 2399gms, nl AFI Vasa previa confirmed  A: 33 w 6d IUP Vasa Previa Preterm contractions resolved  P: Monitor BP NST x one hr tid MFM fu for growth done NICU consult done Nifedipine XL bid prn Primary csection 34-35 weeks.(see MFM note) INPT management until delivery

## 2017-02-06 ENCOUNTER — Other Ambulatory Visit: Payer: Self-pay

## 2017-02-06 NOTE — Plan of Care (Signed)
Problem: Coping: Goal: Level of anxiety will decrease Outcome: Progressing Patient is calm and denies anxiety.  Goal: Participation in decision-making will improve Outcome: Progressing Patient reports that she and her doctor discussed when she should have her c/section and they decided on Friday 02/11/17 for her surgery.

## 2017-02-06 NOTE — Progress Notes (Signed)
Patient ID: [name] [name], female   DOB: 10/19/1995, 21 y.o.   MRN: [id number] HD 14 34w 0d Vasa Previa  S: No complaints. Good FM. Rare contractions. No bleeding. No CP or SOB. Anxious to deliver. No headaches or visual changes. No epigastric pain   O: BP (!) 145/51 (BP Location: Right Arm)   Pulse 73   Temp 98.1 F (36.7 C) (Oral)   Resp 16   Ht 5\' 4"  (1.626 m)   Wt 93 kg (205 lb)   LMP 06/13/2016   SpO2 98%   BMI 35.19 kg/m   HEENT: nl Neck: supple with FROM Lungs: CTA CV : RRR ABD: Gravid, NT NO CVAT EXT: neg cords, SCDs  Neuro: non focal Skin: intact  FHR 145-150, BTBV 5-25, 15x15accels, rare variable decel with contraction, rare contractions Category 1 tracing x 3   NL growth sono with MFM on 11/1 2399gms, nl AFI Vasa previa confirmed  A: 34 w 0d IUP Vasa Previa Preterm contractions resolved  P: Monitor BP NST x one hr tid MFM fu for growth done NICU consult done Nifedipine XL bid prn Primary csection 34-35 weeks.(see MFM note) INPT management until delivery

## 2017-02-07 LAB — TYPE AND SCREEN
ABO/RH(D): A POS
ANTIBODY SCREEN: NEGATIVE

## 2017-02-07 NOTE — Progress Notes (Signed)
Patient ID: [name] [name], female   DOB: 03/08/1996, 21 y.o.   MRN: [id number] HD 15 34w 1d Vasa Previa  S: No complaints. Good FM. Rare contractions. No bleeding. No CP or SOB. Anxious to deliver. No headaches or visual changes. No epigastric pain. Questions answered about csection process  O: BP 113/68 (BP Location: Right Arm)   Pulse 82   Temp 99.2 F (37.3 C) (Oral)   Resp 20   Ht 5\' 4"  (1.626 m)   Wt 93 kg (205 lb)   LMP 06/13/2016   SpO2 97%   BMI 35.19 kg/m   HEENT: nl Neck: supple with FROM Lungs: CTA CV : RRR ABD: Gravid, NT NO CVAT EXT: neg cords, SCDs  Neuro: non focal Skin: intact  FHR 145-150, BTBV 5-25, 15x15accels, rare variable decel with contraction, rare contractions Category 1 tracing x 3   NL growth sono with MFM on 11/1 2399gms, nl AFI Vasa previa confirmed  A: 34 w 1d IUP Vasa Previa Preterm contractions resolved  P: Booster BMZ dose this week NST x one hr tid MFM fu for growth done NICU consult done Nifedipine XL bid prn Primary csection 34-35 weeks.(see MFM note) INPT management until delivery

## 2017-02-08 LAB — TYPE AND SCREEN
ABO/RH(D): A POS
ANTIBODY SCREEN: NEGATIVE

## 2017-02-08 NOTE — Progress Notes (Signed)
Patient ID: [name] [name], female   DOB: 01/08/1996, 21 y.o.   MRN: [id number] HD 16 34w 2d Vasa Previa  S: No complaints. Good FM. Rare contractions. No bleeding. No CP or SOB. Anxious to deliver. No headaches or visual changes. No epigastric pain. Questions answered about csection process  O: BP 121/63 (BP Location: Right Arm)   Pulse 83   Temp 98.4 F (36.9 C) (Oral)   Resp 17   Ht 5\' 4"  (1.626 m)   Wt 93 kg (205 lb)   LMP 06/13/2016   SpO2 100%   BMI 35.19 kg/m   HEENT: nl Neck: supple with FROM Lungs: CTA CV : RRR ABD: Gravid, NT NO CVAT EXT: neg cords, SCDs  Neuro: non focal Skin: intact  FHR 145-150, BTBV 5-25, 15x15accels, rare variable decel with contraction, rare contractions Category 1 tracing x 3   NL growth sono with MFM on 11/1 2399gms, nl AFI Vasa previa confirmed  A: 34 w 2d IUP Vasa Previa Preterm contractions resolved  P: Booster BMZ dose 11/8 NST x one hr tid MFM fu for growth done NICU consult done Nifedipine XL bid prn Primary csection on 11/9 INPT management until delivery

## 2017-02-09 MED ORDER — NIFEDIPINE ER OSMOTIC RELEASE 30 MG PO TB24
30.0000 mg | ORAL_TABLET | Freq: Once | ORAL | Status: AC
  Administered 2017-02-09: 30 mg via ORAL
  Filled 2017-02-09: qty 1

## 2017-02-09 MED ORDER — BETAMETHASONE SOD PHOS & ACET 6 (3-3) MG/ML IJ SUSP
12.0000 mg | Freq: Once | INTRAMUSCULAR | Status: AC
  Administered 2017-02-09: 12 mg via INTRAMUSCULAR
  Filled 2017-02-09: qty 2

## 2017-02-09 NOTE — Progress Notes (Signed)
Patient ID: [name] [name], female   DOB: 11/30/1995, 21 y.o.   MRN: [id number] HD 17 34w 3d Vasa Previa  S: No complaints. Good FM. Rare contractions. No bleeding. No CP or SOB. Anxious to deliver. No headaches or visual changes. No epigastric pain. Questions answered about csection process  O: BP 128/62 (BP Location: Right Arm)   Pulse 78   Temp 98.9 F (37.2 C)   Resp 15   Ht 5\' 4"  (1.626 m)   Wt 93 kg (205 lb)   LMP 06/13/2016   SpO2 98%   BMI 35.19 kg/m   HEENT: nl Neck: supple with FROM Lungs: CTA CV : RRR ABD: Gravid, NT NO CVAT EXT: neg cords, SCDs  Neuro: non focal Skin: intact  FHR 145-150, BTBV 5-25, 15x15accels, rare variable decel with contraction, rare contractions Category 1 tracing x 3   NL growth sono with MFM on 11/1 2399gms, nl AFI Vasa previa confirmed  A: 34 w 3d IUP Vasa Previa Preterm contractions resolved  P: Booster BMZ dose tonight NST x one hr tid MFM fu for growth done NICU consult done Nifedipine XL bid prn Primary csection on 11/9 INPT management until delivery

## 2017-02-09 NOTE — Progress Notes (Addendum)
This note also relates to the following rows which could not be included: SpO2 - Cannot attach notes to unvalidated device data    02/09/17 2253  Fetal Heart Rate A  Mode External  Baseline Rate (A) 140 bpm  Variability 6-25 BPM  Accelerations 15 x 15  Decelerations Variable  Multiple birth? N  Uterine Activity  Mode Toco (Frequent "spikes " of fetal activity on monitor.)  Contraction Frequency (min) UI ctx 1-223min   Contraction Duration (sec) 30-60  Contraction Quality Mild  Resting Tone Palpated Relaxed  Resting Time Adequate  Call to Dr. Algie CofferFogelman to report unusual appearing uterine activity. "Spikes" noted on EFM strip. With fetal heart rate accels  And one variable decel noted in one hour of monitoring. Dr. Algie CofferFogelman deferred, and instructed RN to call Dr. Billy Coastaavon.   Dr. Billy Coastaavon notified of the findings on EFM. "Spikes" and mild contractions with FHR. accels and one Variable decel noted in one hour's monitoring. Instructed to give Nifedipine xl 30 mg po x1.   Tracing reviewed. UI noted. Rare contractions. No contractions noted or perceived by patient Category 1 tracing with artifact on toco noted. Give Nifedipine XL x one for UI.

## 2017-02-09 NOTE — Progress Notes (Addendum)
Called Inetta Fermoina (charge), in NICU to advise of delivery of patient for vasa previa Friday, November 9. 34/5 gestation.

## 2017-02-10 ENCOUNTER — Other Ambulatory Visit (HOSPITAL_COMMUNITY): Payer: Medicaid Other

## 2017-02-10 ENCOUNTER — Other Ambulatory Visit: Payer: Self-pay | Admitting: Obstetrics and Gynecology

## 2017-02-10 NOTE — Anesthesia Preprocedure Evaluation (Addendum)
Anesthesia Evaluation  Patient identified by MRN, date of birth, ID band Patient awake    Reviewed: Allergy & Precautions, NPO status , Patient's Chart, lab work & pertinent test results  Airway Mallampati: II  TM Distance: >3 FB Neck ROM: Full    Dental no notable dental hx. (+) Teeth Intact   Pulmonary neg pulmonary ROS,    Pulmonary exam normal breath sounds clear to auscultation       Cardiovascular negative cardio ROS Normal cardiovascular exam Rhythm:Regular Rate:Normal     Neuro/Psych  Headaches, negative psych ROS   GI/Hepatic Neg liver ROS, GERD  ,  Endo/Other  negative endocrine ROS  Renal/GU negative Renal ROS  negative genitourinary   Musculoskeletal negative musculoskeletal ROS (+)   Abdominal   Peds  Hematology negative hematology ROS (+)   Anesthesia Other Findings   Reproductive/Obstetrics (+) Pregnancy Vasa previa 34 5/7 weeks Pre term labor                           Anesthesia Physical Anesthesia Plan  ASA: II  Anesthesia Plan: Spinal   Post-op Pain Management:    Induction:   PONV Risk Score and Plan: 4 or greater and Scopolamine patch - Pre-op, Ondansetron, Treatment may vary due to age or medical condition, Dexamethasone and Metaclopromide  Airway Management Planned: Natural Airway and Nasal Cannula  Additional Equipment:   Intra-op Plan:   Post-operative Plan:   Informed Consent: I have reviewed the patients History and Physical, chart, labs and discussed the procedure including the risks, benefits and alternatives for the proposed anesthesia with the patient or authorized representative who has indicated his/her understanding and acceptance.   Dental advisory given  Plan Discussed with: CRNA, Anesthesiologist and Surgeon  Anesthesia Plan Comments:        Anesthesia Quick Evaluation

## 2017-02-10 NOTE — Progress Notes (Signed)
Patient ID: [name] [name], female   DOB: 11/12/1995, 21 y.o.   MRN: [id number] HD 18 34w 4d Vasa Previa  S: No complaints. Good FM. Rare contractions. No bleeding. No CP or SOB. Anxious to deliver. No headaches or visual changes. No epigastric pain. Questions answered about csection process Contractions noted last night. Received Nifedipine x one.  O: BP (!) 114/49 (BP Location: Right Arm)   Pulse 89   Temp 98.3 F (36.8 C) (Oral)   Resp 18   Ht 5\' 4"  (1.626 m)   Wt 93 kg (205 lb)   LMP 06/13/2016   SpO2 95%   BMI 35.19 kg/m   HEENT: nl Neck: supple with FROM Lungs: CTA CV : RRR ABD: Gravid, NT NO CVAT EXT: neg cords, SCDs  Neuro: non focal Skin: intact  FHR 145-150, BTBV 5-25, 15x15accels, rare variable decel with contraction, rare contractions Category 1 tracing x 3   NL growth sono with MFM on 11/1 2399gms, nl AFI Vasa previa confirmed  A: 34 w 4d IUP Vasa Previa Preterm contractions resolved  P: Booster BMZ dose done NST x one hr tid MFM fu for growth done NICU consult done- aware of csection tomorrow Nifedipine XL bid prn Primary csection on 11/9- tomorrow Consented. Risks vs benefits discussed.

## 2017-02-10 NOTE — Plan of Care (Signed)
Plan for primary c/section on 02/11/17. Pre-op teaching reviewed with patient. Questions encouraged and answers provided. Patient verbalized understanding of teaching. She stated, "I'm glad to know what to expect. It makes me worry less."

## 2017-02-10 NOTE — Plan of Care (Addendum)
No vaginal bleeding or leakage of fluid. Monitoring shows uterine irritability and mild contractions. Nifedipine given one time this shift to reduce contractions. Patient denies feeling contractions. Instructed how to palpate for uterine tightening so she can inform nursing staff and initiate monitoring.   Tracing reviewed Likely toco artifact noted

## 2017-02-10 NOTE — Progress Notes (Addendum)
Patient given Nifedipine XL 30 mg po for contractions per order of Dr. Billy Coastaavon. EFM on to monitor uterine activity. Patient agrees with plan of care. Patient also cautioned that she should be careful when she needs to get OOB due to an expectation of decreased blood pressure with this medication and possible fainting.   See previous note Tracing reviewed Patient has taken Nifedipine previously without complications.

## 2017-02-11 ENCOUNTER — Encounter (HOSPITAL_COMMUNITY): Payer: Self-pay | Admitting: Anesthesiology

## 2017-02-11 ENCOUNTER — Inpatient Hospital Stay (HOSPITAL_COMMUNITY): Payer: Medicaid Other | Admitting: Anesthesiology

## 2017-02-11 ENCOUNTER — Encounter (HOSPITAL_COMMUNITY)
Admission: AD | Disposition: A | Discharge status: Home / Self Care | Payer: Self-pay | Source: Ambulatory Visit | Attending: Obstetrics and Gynecology

## 2017-02-11 LAB — CBC
HCT: 35.3 % — ABNORMAL LOW (ref 36.0–46.0)
HEMOGLOBIN: 12 g/dL (ref 12.0–15.0)
MCH: 28.8 pg (ref 26.0–34.0)
MCHC: 34 g/dL (ref 30.0–36.0)
MCV: 84.9 fL (ref 78.0–100.0)
Platelets: 232 10*3/uL (ref 150–400)
RBC: 4.16 MIL/uL (ref 3.87–5.11)
RDW: 13.3 % (ref 11.5–15.5)
WBC: 18.8 10*3/uL — ABNORMAL HIGH (ref 4.0–10.5)

## 2017-02-11 LAB — TYPE AND SCREEN
ABO/RH(D): A POS
Antibody Screen: NEGATIVE

## 2017-02-11 SURGERY — Surgical Case
Anesthesia: Spinal | Site: Abdomen | Wound class: Clean Contaminated

## 2017-02-11 MED ORDER — TETANUS-DIPHTH-ACELL PERTUSSIS 5-2.5-18.5 LF-MCG/0.5 IM SUSP: 0.5000 mL | Freq: Once | INTRAMUSCULAR | Status: DC

## 2017-02-11 MED ORDER — LACTATED RINGERS IV SOLN
INTRAVENOUS | Status: DC
  Administered 2017-02-11: 08:00:00 via INTRAVENOUS

## 2017-02-11 MED ORDER — METHYLERGONOVINE MALEATE 0.2 MG/ML IJ SOLN: 0.2000 mg | INTRAMUSCULAR | Status: DC | PRN

## 2017-02-11 MED ORDER — LACTATED RINGERS IV SOLN
INTRAVENOUS | Status: DC | PRN
  Administered 2017-02-11: 40 [IU] via INTRAVENOUS

## 2017-02-11 MED ORDER — METOCLOPRAMIDE HCL 5 MG/ML IJ SOLN: 10.0000 mg | Freq: Once | INTRAMUSCULAR | Status: DC | PRN

## 2017-02-11 MED ORDER — ONDANSETRON HCL 4 MG/2ML IJ SOLN
INTRAMUSCULAR | Status: AC
  Filled 2017-02-11: qty 2

## 2017-02-11 MED ORDER — SIMETHICONE 80 MG PO CHEW
80.0000 mg | CHEWABLE_TABLET | ORAL | Status: DC
  Administered 2017-02-12 – 2017-02-13 (×2): 80 mg via ORAL
  Filled 2017-02-11 (×2): qty 1

## 2017-02-11 MED ORDER — LACTATED RINGERS IV SOLN
INTRAVENOUS | Status: DC
  Administered 2017-02-11: 18:00:00 via INTRAVENOUS

## 2017-02-11 MED ORDER — FENTANYL CITRATE (PF) 100 MCG/2ML IJ SOLN
INTRAMUSCULAR | Status: DC | PRN
  Administered 2017-02-11: 20 ug via INTRATHECAL

## 2017-02-11 MED ORDER — NALOXONE HCL 0.4 MG/ML IJ SOLN: 0.4000 mg | INTRAMUSCULAR | Status: DC | PRN

## 2017-02-11 MED ORDER — CEFAZOLIN SODIUM-DEXTROSE 2-4 GM/100ML-% IV SOLN
2.0000 g | INTRAVENOUS | Status: AC
  Administered 2017-02-11: 2 g via INTRAVENOUS
  Filled 2017-02-11: qty 100

## 2017-02-11 MED ORDER — SCOPOLAMINE 1 MG/3DAYS TD PT72: 1.0000 | MEDICATED_PATCH | Freq: Once | TRANSDERMAL | Status: DC

## 2017-02-11 MED ORDER — MORPHINE SULFATE (PF) 0.5 MG/ML IJ SOLN
INTRAMUSCULAR | Status: DC | PRN
Start: 2017-02-11 — End: 2017-02-11
  Administered 2017-02-11: 15 ug via INTRATHECAL
  Administered 2017-02-11: .2 ug via INTRATHECAL

## 2017-02-11 MED ORDER — BUPIVACAINE IN DEXTROSE 0.75-8.25 % IT SOLN
INTRATHECAL | Status: DC | PRN
  Administered 2017-02-11: 1.5 mL via INTRATHECAL

## 2017-02-11 MED ORDER — PHENYLEPHRINE 8 MG IN D5W 100 ML (0.08MG/ML) PREMIX OPTIME
INJECTION | INTRAVENOUS | Status: DC | PRN
  Administered 2017-02-11: 20 ug/min via INTRAVENOUS

## 2017-02-11 MED ORDER — MENTHOL 3 MG MT LOZG: 1.0000 | LOZENGE | OROMUCOSAL | Status: DC | PRN

## 2017-02-11 MED ORDER — DIPHENHYDRAMINE HCL 50 MG/ML IJ SOLN: 12.5000 mg | INTRAMUSCULAR | Status: DC | PRN

## 2017-02-11 MED ORDER — COCONUT OIL OIL: 1.0000 "application " | TOPICAL_OIL | Status: DC | PRN

## 2017-02-11 MED ORDER — DIPHENHYDRAMINE HCL 25 MG PO CAPS
25.0000 mg | ORAL_CAPSULE | ORAL | Status: DC | PRN
  Administered 2017-02-12: 25 mg via ORAL
  Filled 2017-02-11 (×3): qty 1

## 2017-02-11 MED ORDER — SOD CITRATE-CITRIC ACID 500-334 MG/5ML PO SOLN
ORAL | Status: AC
  Administered 2017-02-11: 30 mL
  Filled 2017-02-11: qty 15

## 2017-02-11 MED ORDER — NALBUPHINE HCL 10 MG/ML IJ SOLN: 5.0000 mg | Freq: Once | INTRAMUSCULAR | Status: DC | PRN

## 2017-02-11 MED ORDER — NALBUPHINE HCL 10 MG/ML IJ SOLN: 5.0000 mg | INTRAMUSCULAR | Status: DC | PRN

## 2017-02-11 MED ORDER — PHENYLEPHRINE HCL 10 MG/ML IJ SOLN
INTRAMUSCULAR | Status: DC | PRN
  Administered 2017-02-11: 80 ug via INTRAVENOUS
  Administered 2017-02-11 (×2): 40 ug via INTRAVENOUS

## 2017-02-11 MED ORDER — SODIUM CHLORIDE 0.9% FLUSH: 3.0000 mL | INTRAVENOUS | Status: DC | PRN

## 2017-02-11 MED ORDER — LACTATED RINGERS IV SOLN
INTRAVENOUS | Status: DC | PRN
  Administered 2017-02-11 (×3): via INTRAVENOUS

## 2017-02-11 MED ORDER — ONDANSETRON HCL 4 MG/2ML IJ SOLN: 4.0000 mg | Freq: Three times a day (TID) | INTRAMUSCULAR | Status: DC | PRN

## 2017-02-11 MED ORDER — SCOPOLAMINE 1 MG/3DAYS TD PT72
MEDICATED_PATCH | TRANSDERMAL | Status: AC
  Filled 2017-02-11: qty 1

## 2017-02-11 MED ORDER — ZOLPIDEM TARTRATE 5 MG PO TABS: 5.0000 mg | ORAL_TABLET | Freq: Every evening | ORAL | Status: DC | PRN

## 2017-02-11 MED ORDER — OXYCODONE-ACETAMINOPHEN 5-325 MG PO TABS
2.0000 | ORAL_TABLET | ORAL | Status: DC | PRN
Start: 2017-02-11 — End: 2017-02-14
  Administered 2017-02-14: 2 via ORAL
  Filled 2017-02-11: qty 2

## 2017-02-11 MED ORDER — MEPERIDINE HCL 25 MG/ML IJ SOLN: 6.2500 mg | INTRAMUSCULAR | Status: DC | PRN

## 2017-02-11 MED ORDER — FENTANYL CITRATE (PF) 100 MCG/2ML IJ SOLN: 25.0000 ug | INTRAMUSCULAR | Status: DC | PRN

## 2017-02-11 MED ORDER — SCOPOLAMINE 1 MG/3DAYS TD PT72
MEDICATED_PATCH | TRANSDERMAL | Status: DC | PRN
  Administered 2017-02-11: 1 via TRANSDERMAL

## 2017-02-11 MED ORDER — SOD CITRATE-CITRIC ACID 500-334 MG/5ML PO SOLN
ORAL | Status: AC
  Filled 2017-02-11: qty 15

## 2017-02-11 MED ORDER — PHENYLEPHRINE 8 MG IN D5W 100 ML (0.08MG/ML) PREMIX OPTIME
INJECTION | INTRAVENOUS | Status: AC
  Filled 2017-02-11: qty 100

## 2017-02-11 MED ORDER — LACTATED RINGERS IV SOLN: INTRAVENOUS | Status: DC

## 2017-02-11 MED ORDER — METOCLOPRAMIDE HCL 5 MG/ML IJ SOLN
INTRAMUSCULAR | Status: DC | PRN
  Administered 2017-02-11: 10 mg via INTRAVENOUS

## 2017-02-11 MED ORDER — SIMETHICONE 80 MG PO CHEW
80.0000 mg | CHEWABLE_TABLET | Freq: Three times a day (TID) | ORAL | Status: DC
  Administered 2017-02-11 – 2017-02-14 (×9): 80 mg via ORAL
  Filled 2017-02-11 (×9): qty 1

## 2017-02-11 MED ORDER — BUPIVACAINE IN DEXTROSE 0.75-8.25 % IT SOLN
INTRATHECAL | Status: AC
  Filled 2017-02-11: qty 2

## 2017-02-11 MED ORDER — OXYTOCIN 10 UNIT/ML IJ SOLN
INTRAMUSCULAR | Status: AC
  Filled 2017-02-11: qty 4

## 2017-02-11 MED ORDER — SOD CITRATE-CITRIC ACID 500-334 MG/5ML PO SOLN: 30.0000 mL | Freq: Once | ORAL | Status: DC

## 2017-02-11 MED ORDER — OXYCODONE-ACETAMINOPHEN 5-325 MG PO TABS
1.0000 | ORAL_TABLET | ORAL | Status: DC | PRN
Start: 2017-02-11 — End: 2017-02-14
  Administered 2017-02-13: 1 via ORAL
  Filled 2017-02-11: qty 1

## 2017-02-11 MED ORDER — KETOROLAC TROMETHAMINE 30 MG/ML IJ SOLN
INTRAMUSCULAR | Status: AC
  Filled 2017-02-11: qty 1

## 2017-02-11 MED ORDER — OXYTOCIN 40 UNITS IN LACTATED RINGERS INFUSION - SIMPLE MED: 2.5000 [IU]/h | INTRAVENOUS | Status: AC

## 2017-02-11 MED ORDER — BUPIVACAINE HCL (PF) 0.25 % IJ SOLN
INTRAMUSCULAR | Status: DC | PRN
  Administered 2017-02-11: 20 mL

## 2017-02-11 MED ORDER — KETOROLAC TROMETHAMINE 30 MG/ML IJ SOLN
30.0000 mg | Freq: Four times a day (QID) | INTRAMUSCULAR | Status: AC | PRN
  Administered 2017-02-11: 30 mg via INTRAMUSCULAR

## 2017-02-11 MED ORDER — PHENYLEPHRINE 40 MCG/ML (10ML) SYRINGE FOR IV PUSH (FOR BLOOD PRESSURE SUPPORT)
PREFILLED_SYRINGE | INTRAVENOUS | Status: AC
  Filled 2017-02-11: qty 10

## 2017-02-11 MED ORDER — KETOROLAC TROMETHAMINE 30 MG/ML IJ SOLN: 30.0000 mg | Freq: Four times a day (QID) | INTRAMUSCULAR | Status: AC | PRN

## 2017-02-11 MED ORDER — PRENATAL MULTIVITAMIN CH
1.0000 | ORAL_TABLET | Freq: Every day | ORAL | Status: DC
  Administered 2017-02-11 – 2017-02-13 (×3): 1 via ORAL
  Filled 2017-02-11 (×3): qty 1

## 2017-02-11 MED ORDER — ONDANSETRON HCL 4 MG/2ML IJ SOLN
INTRAMUSCULAR | Status: DC | PRN
  Administered 2017-02-11: 4 mg via INTRAVENOUS

## 2017-02-11 MED ORDER — MORPHINE SULFATE (PF) 0.5 MG/ML IJ SOLN
INTRAMUSCULAR | Status: AC
  Filled 2017-02-11: qty 10

## 2017-02-11 MED ORDER — FENTANYL CITRATE (PF) 100 MCG/2ML IJ SOLN
INTRAMUSCULAR | Status: AC
  Filled 2017-02-11: qty 2

## 2017-02-11 MED ORDER — ACETAMINOPHEN 325 MG PO TABS
650.0000 mg | ORAL_TABLET | ORAL | Status: DC | PRN
  Administered 2017-02-11 – 2017-02-13 (×2): 650 mg via ORAL
  Filled 2017-02-11 (×2): qty 2

## 2017-02-11 MED ORDER — METHYLERGONOVINE MALEATE 0.2 MG PO TABS: 0.2000 mg | ORAL_TABLET | ORAL | Status: DC | PRN

## 2017-02-11 MED ORDER — DIPHENHYDRAMINE HCL 25 MG PO CAPS
25.0000 mg | ORAL_CAPSULE | Freq: Four times a day (QID) | ORAL | Status: DC | PRN
  Administered 2017-02-12: 25 mg via ORAL

## 2017-02-11 MED ORDER — BUPIVACAINE HCL (PF) 0.25 % IJ SOLN
INTRAMUSCULAR | Status: AC
  Filled 2017-02-11: qty 20

## 2017-02-11 MED ORDER — NALOXONE HCL 2 MG/2ML IJ SOSY
1.0000 ug/kg/h | PREFILLED_SYRINGE | INTRAMUSCULAR | Status: DC | PRN
  Filled 2017-02-11: qty 2

## 2017-02-11 MED ORDER — METOCLOPRAMIDE HCL 5 MG/ML IJ SOLN
INTRAMUSCULAR | Status: AC
  Filled 2017-02-11: qty 2

## 2017-02-11 MED ORDER — DIBUCAINE 1 % RE OINT: 1.0000 "application " | TOPICAL_OINTMENT | RECTAL | Status: DC | PRN

## 2017-02-11 MED ORDER — SIMETHICONE 80 MG PO CHEW: 80.0000 mg | CHEWABLE_TABLET | ORAL | Status: DC | PRN

## 2017-02-11 MED ORDER — WITCH HAZEL-GLYCERIN EX PADS: 1.0000 "application " | MEDICATED_PAD | CUTANEOUS | Status: DC | PRN

## 2017-02-11 MED ORDER — IBUPROFEN 600 MG PO TABS
600.0000 mg | ORAL_TABLET | Freq: Four times a day (QID) | ORAL | Status: DC
  Administered 2017-02-11 – 2017-02-14 (×10): 600 mg via ORAL
  Filled 2017-02-11 (×11): qty 1

## 2017-02-11 MED ORDER — SENNOSIDES-DOCUSATE SODIUM 8.6-50 MG PO TABS
2.0000 | ORAL_TABLET | ORAL | Status: DC
  Administered 2017-02-12 – 2017-02-13 (×2): 2 via ORAL
  Filled 2017-02-11 (×2): qty 2

## 2017-02-11 MED ORDER — DEXAMETHASONE SODIUM PHOSPHATE 4 MG/ML IJ SOLN
INTRAMUSCULAR | Status: AC
  Filled 2017-02-11: qty 1

## 2017-02-11 MED ORDER — DEXAMETHASONE SODIUM PHOSPHATE 4 MG/ML IJ SOLN
INTRAMUSCULAR | Status: DC | PRN
  Administered 2017-02-11: 4 mg via INTRAVENOUS

## 2017-02-11 MED ORDER — CEFAZOLIN SODIUM-DEXTROSE 2-3 GM-%(50ML) IV SOLR
INTRAVENOUS | Status: AC
  Filled 2017-02-11: qty 50

## 2017-02-11 SURGICAL SUPPLY — 34 items
CHLORAPREP W/TINT 26ML (MISCELLANEOUS) ×2 IMPLANT
CLAMP CORD UMBIL (MISCELLANEOUS) IMPLANT
CLOTH BEACON ORANGE TIMEOUT ST (SAFETY) ×2 IMPLANT
CONTAINER PREFILL 10% NBF 15ML (MISCELLANEOUS) IMPLANT
DERMABOND ADVANCED (GAUZE/BANDAGES/DRESSINGS) ×2
DERMABOND ADVANCED .7 DNX12 (GAUZE/BANDAGES/DRESSINGS) ×2 IMPLANT
DRSG OPSITE POSTOP 4X10 (GAUZE/BANDAGES/DRESSINGS) ×2 IMPLANT
ELECT REM PT RETURN 9FT ADLT (ELECTROSURGICAL) ×2
ELECTRODE REM PT RTRN 9FT ADLT (ELECTROSURGICAL) ×1 IMPLANT
EXTRACTOR VACUUM M CUP 4 TUBE (SUCTIONS) IMPLANT
GLOVE BIO SURGEON STRL SZ7.5 (GLOVE) ×2 IMPLANT
GLOVE BIOGEL PI IND STRL 7.0 (GLOVE) ×1 IMPLANT
GLOVE BIOGEL PI INDICATOR 7.0 (GLOVE) ×1
GOWN STRL REUS W/TWL LRG LVL3 (GOWN DISPOSABLE) ×4 IMPLANT
KIT ABG SYR 3ML LUER SLIP (SYRINGE) IMPLANT
NEEDLE HYPO 22GX1.5 SAFETY (NEEDLE) ×2 IMPLANT
NEEDLE HYPO 25X5/8 SAFETYGLIDE (NEEDLE) IMPLANT
NEEDLE SPNL 20GX3.5 QUINCKE YW (NEEDLE) IMPLANT
NS IRRIG 1000ML POUR BTL (IV SOLUTION) ×2 IMPLANT
PACK C SECTION WH (CUSTOM PROCEDURE TRAY) ×2 IMPLANT
PENCIL SMOKE EVAC W/HOLSTER (ELECTROSURGICAL) ×2 IMPLANT
SUT MNCRL 0 VIOLET CTX 36 (SUTURE) ×2 IMPLANT
SUT MNCRL AB 3-0 PS2 27 (SUTURE) IMPLANT
SUT MON AB 2-0 CT1 27 (SUTURE) ×2 IMPLANT
SUT MON AB-0 CT1 36 (SUTURE) ×4 IMPLANT
SUT MONOCRYL 0 CTX 36 (SUTURE) ×2
SUT PLAIN 0 NONE (SUTURE) IMPLANT
SUT PLAIN 2 0 (SUTURE)
SUT PLAIN 2 0 XLH (SUTURE) IMPLANT
SUT PLAIN ABS 2-0 CT1 27XMFL (SUTURE) IMPLANT
SYR 20CC LL (SYRINGE) IMPLANT
SYR CONTROL 10ML LL (SYRINGE) ×2 IMPLANT
TOWEL OR 17X24 6PK STRL BLUE (TOWEL DISPOSABLE) ×2 IMPLANT
TRAY FOLEY BAG SILVER LF 14FR (SET/KITS/TRAYS/PACK) ×2 IMPLANT

## 2017-02-11 NOTE — Lactation Note (Signed)
This note was copied from a baby's chart. Lactation Consultation Note  Patient Name: [name] [name] [name] [name] Reason for consult: Initial assessment;1st time breastfeeding;NICU baby;Late-preterm 34-36.6wks;Infant < 6lbs   Initial assessment with mom of 4 hour old NICU infant. Mom has pumped x 1 in PACU and was able to get colostrum to [facility] to [facility].   Providing Milk for Your Baby in NICU booklet give. Reviewed pumping every 2-3 hours for 15 minutes on Initiate setting with a 4-5 hour stretch at night of no pumping. Enc mom to follow pumping with hand expression. Mom was shown how to hand express and a gtt was obtained on the right breast. Mom with soft compressible breasts and areola with short shaft everted nipples. BM storage and labeling for NICU infant discussed.   Mom has a Spectra 2 pump for home use. Discussed supply and demand and milk coming to volume. Discussed that hospital grade pump may work best in the first 2 weeks to induce and protect supply with NICU infant. [facility]   Mom, GM and FOB shown how to operate pump, assembling, disassembling and cleaning of pump parts. Mom reports she has no further questions/concerns at this time.   Enc mom to call out with any questions/concerns prn. BF Resources handout and LC Brochure given, mom informed of IP/OP Services, BF Support Groups and LC phone #.   Mom reports she has no questions/concerns at this time.    Maternal Data Formula Feeding for Exclusion: No Has patient been taught Hand Expression?: Yes Does the patient have breastfeeding experience prior to this delivery?: No  Feeding    LATCH Score                   Interventions Interventions: DEBP;Hand express  Lactation Tools Discussed/Used WIC Program: Yes Pump Review: Setup, frequency, and cleaning;Milk Storage Initiated by:: Noralee StainSharon Manuelito Poage, RN, IBCLC Date initiated::  02/11/17   Consult Status Consult Status: Follow-up Date: 02/12/17 Follow-up type: In-patient    Silas FloodSharon S Vandell Kun [name], 12:31 PM

## 2017-02-11 NOTE — Progress Notes (Signed)
Patient ID: Marquita Palmsemi R Eisinger, female   DOB: 08/03/1995, 21 y.o.   MRN: [id number] Patient seen and examined. Consent witnessed and signed. No changes noted. Update completed. BP (!) 120/47 (BP Location: Right Arm)   Pulse 94   Temp 98.8 F (37.1 C) (Oral)   Resp 16   Ht 5\' 4"  (1.626 m)   Wt 93 kg (205 lb)   LMP 06/13/2016   SpO2 98%   BMI 35.19 kg/m   CBC    Component Value Date/Time   WBC 18.8 (H) 02/10/2017 2355   RBC 4.16 02/10/2017 2355   HGB 12.0 02/10/2017 2355   HCT 35.3 (L) 02/10/2017 2355   PLT 232 02/10/2017 2355   MCV 84.9 02/10/2017 2355   MCH 28.8 02/10/2017 2355   MCHC 34.0 02/10/2017 2355   RDW 13.3 02/10/2017 2355   LYMPHSABS 2.5 01/09/2017 1737   MONOABS 0.4 01/09/2017 1737   EOSABS 0.1 01/09/2017 1737   BASOSABS 0.0 01/09/2017 1737

## 2017-02-11 NOTE — Anesthesia Postprocedure Evaluation (Signed)
Anesthesia Post Note  Patient: [name] [name]  Procedure(s) Performed: Primary CESAREAN SECTION (N/A Abdomen)     Patient location during evaluation: PACU Anesthesia Type: Spinal Level of consciousness: oriented and awake and alert Pain management: pain level controlled Vital Signs Assessment: post-procedure vital signs reviewed and stable Respiratory status: spontaneous breathing, respiratory function stable and nonlabored ventilation Cardiovascular status: blood pressure returned to baseline and stable Postop Assessment: no headache, no backache, no apparent nausea or vomiting, spinal receding and patient able to bend at knees Anesthetic complications: no    Last Vitals:  Vitals:   02/11/17 0933 02/11/17 0934  BP:    Pulse: 75 77  Resp: 14 (!) 23  Temp:    SpO2: 96% 95%    Last Pain:  Vitals:   02/11/17 0600  TempSrc:   PainSc: 0-No pain   Pain Goal: Patients Stated Pain Goal: 5 (02/08/17 1950)               Lynna Zamorano A.

## 2017-02-11 NOTE — Transfer of Care (Signed)
Immediate Anesthesia Transfer of Care Note  Patient: [name] [name]  Procedure(s) Performed: Primary CESAREAN SECTION (N/A Abdomen)  Patient Location: PACU  Anesthesia Type:Spinal  Level of Consciousness: awake, alert  and oriented  Airway & Oxygen Therapy: Patient Spontanous Breathing  Post-op Assessment: Report given to RN and Post -op Vital signs reviewed and stable  Post vital signs: Reviewed and stable  Last Vitals:  Vitals:   02/11/17 0020 02/11/17 0445  BP: 126/79 (!) 120/47  Pulse: 90 94  Resp: 18 16  Temp: 36.9 C 37.1 C  SpO2: 99% 98%    Last Pain:  Vitals:   02/11/17 0600  TempSrc:   PainSc: 0-No pain      Patients Stated Pain Goal: 5 (02/08/17 1950)  Complications: No apparent anesthesia complications

## 2017-02-11 NOTE — Op Note (Signed)
Cesarean Section Procedure Note  Indications: Vasa previa  Pre-operative Diagnosis: 34 week 5 day pregnancy.  Post-operative Diagnosis: same  Surgeon: Lenoard AdenAAVON,Jarel Cuadra J   Assistants: Renae FicklePaul, CNM  Anesthesia: Local anesthesia 0.25.% bupivacaine and Spinal anesthesia  ASA Class: 2  Procedure Details  The patient was seen in the Holding Room. The risks, benefits, complications, treatment options, and expected outcomes were discussed with the patient.  The patient concurred with the proposed plan, giving informed consent. The risks of anesthesia, infection, bleeding and possible injury to other organs discussed. Injury to bowel, bladder, or ureter with possible need for repair discussed. Possible need for transfusion with secondary risks of hepatitis or HIV acquisition discussed. Post operative complications to include but not limited to DVT, PE and Pneumonia noted. The site of surgery properly noted/marked. The patient was taken to Operating Room # 9, identified as Colette R Shel[facility] and the procedure verified as C-Section Delivery. A Time Out was held and the above information confirmed.  After induction of anesthesia, the patient was draped and prepped in the usual sterile manner. A Pfannenstiel incision was made and carried down through the subcutaneous tissue to the fascia. Fascial incision was made and extended transversely using Mayo scissors. The fascia was separated from the underlying rectus tissue superiorly and inferiorly. The peritoneum was identified and entered. Peritoneal incision was extended longitudinally. The utero-vesical peritoneal reflection was incised transversely and the bladder flap was bluntly freed from the lower uterine segment. A low transverse uterine incision(Kerr hysterotomy) was made. Delivered from OA presentation was a  female with Apgar scores of 8 at one minute and 9 at five minutes. Bulb suctioning gently performed. Neonatal team in attendance.After the umbilical cord was  clamped and cut cord blood was obtained for evaluation. The placenta was removed intact and appeared consistent with vasa previa. The uterus was curetted with a dry lap pack. Good hemostasis was noted.The uterine outline, tubes and ovaries appeared normal. The uterine incision was closed with running locked sutures of 0 Monocryl x 2 layers. Hemostasis was observed. Lavage was carried out until clear.The parietal peritoneum was closed with a running 2-0 Monocryl suture. The fascia was then reapproximated with running sutures of 0 Monocryl. The skin was reapproximated with 3-0 monocryl after Conger closure with 2-0 plain.  Instrument, sponge, and needle counts were correct prior the abdominal closure and at the conclusion of the case.   Findings: Female, post placenta with vasa previa, nl adnexa  Estimated Blood Loss:  500         Drains: foley                 Specimens: placenta to path                 Complications:  None; patient tolerated the procedure well.         Disposition: PACU - hemodynamically stable.         Condition: stable  Attending Attestation: I performed the procedure.

## 2017-02-11 NOTE — Anesthesia Procedure Notes (Signed)
Spinal  Patient location during procedure: OR Start time: 02/11/2017 7:40 AM Staffing Anesthesiologist: Mal AmabileFoster, Annelies Coyt, MD Performed: anesthesiologist  Preanesthetic Checklist Completed: patient identified, site marked, surgical consent, pre-op evaluation, timeout performed, IV checked, risks and benefits discussed and monitors and equipment checked Spinal Block Patient position: sitting Prep: site prepped and draped and DuraPrep Patient monitoring: heart rate, cardiac monitor, continuous pulse ox and blood pressure Approach: midline Location: L3-4 Injection technique: single-shot Needle Needle type: Pencan  Needle gauge: 24 G Needle length: 9 cm Needle insertion depth: 5 cm Assessment Sensory level: T4 Additional Notes Patient tolerated procedure well. Adequate sensory level.

## 2017-02-12 LAB — CBC
HEMATOCRIT: 29.1 % — AB (ref 36.0–46.0)
HEMOGLOBIN: 10 g/dL — AB (ref 12.0–15.0)
MCH: 29.2 pg (ref 26.0–34.0)
MCHC: 34.4 g/dL (ref 30.0–36.0)
MCV: 84.8 fL (ref 78.0–100.0)
Platelets: 234 10*3/uL (ref 150–400)
RBC: 3.43 MIL/uL — AB (ref 3.87–5.11)
RDW: 13.6 % (ref 11.5–15.5)
WBC: 20 10*3/uL — ABNORMAL HIGH (ref 4.0–10.5)

## 2017-02-12 NOTE — Lactation Note (Signed)
This note was copied from a baby's chart. Lactation Consultation Note; Mom reports pumping is going well. Pumped 4 times yesterday. Reviewed importance of frequent  pumping q 2-3 hours to promote a good milk supply. Reports she obtained a few drops the first 2 times but none the last 2 times. Encouragement given. Encouraged hand expression after pumping. No questions at present. [facility] To call prn  Patient Name: [name] [name] HYQMV'HToday's Date: 02/12/2017 Reason for consult: Follow-up assessment;Late-preterm 34-36.6wks;NICU baby   Maternal Data Formula Feeding for Exclusion: No Has patient been taught Hand Expression?: Yes Does the patient have breastfeeding experience prior to this delivery?: No  Feeding    LATCH Score                   Interventions    Lactation Tools Discussed/Used WIC Program: Yes   Consult Status Consult Status: Follow-up Date: 02/13/17 Follow-up type: In-patient    Pamelia HoitWeeks, Ianna Salmela D 02/12/2017, 7:49 AM

## 2017-02-12 NOTE — Anesthesia Postprocedure Evaluation (Signed)
Anesthesia Post Note  Patient: [name] [name]  Procedure(s) Performed: Primary CESAREAN SECTION (N/A Abdomen)     Anesthesia Type: Spinal Level of consciousness: awake, awake and alert and oriented Pain management: satisfactory to patient Vital Signs Assessment: post-procedure vital signs reviewed and stable Respiratory status: spontaneous breathing and nonlabored ventilation Cardiovascular status: blood pressure returned to baseline and stable Postop Assessment: no headache, no backache, spinal receding, patient able to bend at knees, no apparent nausea or vomiting and adequate PO intake Anesthetic complications: no    Last Vitals:  Vitals:   02/12/17 0150 02/12/17 0603  BP: (!) 113/48 (!) 110/45  Pulse: (!) 54 (!) 53  Resp: 18 16  Temp: 36.7 C 36.7 C  SpO2: 100% 98%    Last Pain:  Vitals:   02/12/17 0603  TempSrc: Oral  PainSc:    Pain Goal: Patients Stated Pain Goal: 3 (02/11/17 2110)               Mauricia AreaWINN,Shameeka Silliman

## 2017-02-12 NOTE — Progress Notes (Addendum)
1950: Patient seen asleep in bed, SCD's on and call bell in close reach.  0626: Pt went off the unit to NICU. 0600 hr ibuprofen not given.

## 2017-02-12 NOTE — Progress Notes (Signed)
Subjective: POD# 1 Information for the patient's newborn:  [name] [name], Boy [name] [name] [[id number]][[id number]]  female Visit done at NICU bedside  In NICU on CPAP, prematurity  Reports feeling well, up and walking to NICU w/o problems. Feeding: pumping Patient reports tolerating PO.  Breast symptoms: + colostrum Pain controlled with PO meds Denies HA/SOB/C/P/N/V/dizziness. Flatus present. She reports vaginal bleeding as normal, without clots.  She is ambulating, urinating without difficulty.     Objective:   VS:    Vitals:   02/11/17 2110 02/12/17 0150 02/12/17 0603 02/12/17 0800  BP:  (!) 113/48 (!) 110/45 (!) 115/57  Pulse:  (!) 54 (!) 53 (!) 53  Resp: 18 18 16 18   Temp: 98.3 F (36.8 C) 98.1 F (36.7 C) 98 F (36.7 C) 98.6 F (37 C)  TempSrc: Axillary Oral Oral Oral  SpO2: 97% 100% 98% 100%  Weight:      Height:          Intake/Output Summary (Last 24 hours) at 02/12/2017 1026 Last data filed at 02/12/2017 0152 Gross per 24 hour  Intake 1898.33 ml  Output 1850 ml  Net 48.33 ml        Recent Labs    02/10/17 2355 02/12/17 0524  WBC 18.8* 20.0*  HGB 12.0 10.0*  HCT 35.3* 29.1*  PLT 232 234     Blood type: --/--/A POS (11/08 2355)  Rubella: Immune (06/07 0000)     Physical Exam:  General: alert, cooperative and no distress Abdomen: soft, nontender, normal bowel sounds Incision: clean, dry and intact Uterine Fundus: firm, below umbilicus, nontender Lochia: minimal Ext: no edema, redness or tenderness in the calves or thighs      Assessment/Plan: 21 y.o.   POD# 1. [id number]                  Principal Problem:   Cesarean delivery delivered / Indication: vasa previa 11/9 Active Problems:   Vasa previa   Postpartum care following cesarean delivery   Doing well, stable.               Advance diet as tolerated Encourage rest when baby rests Breastfeeding support Encourage to ambulate Routine post-op care  Neta Mendsaniela C Paul, CNM, MSN 02/12/2017, 10:26 AM

## 2017-02-12 NOTE — Progress Notes (Signed)
CSW acknowledges NICU admission.   Patient screened out for psychosocial assessment since none of the following apply:  Psychosocial stressors documented in mother or baby's chart  Gestation less than 32 weeks  Code at delivery   Infant with anomalies  Please contact the Clinical Social Worker if specific needs arise, or by MOB's request.  Kanen Mottola, MSW, LCSW-A Clinical Social Worker  [facility]  Office: [phone]  

## 2017-02-12 NOTE — Addendum Note (Signed)
Addendum  created 02/12/17 0825 by Wilder GladeWinn, Osric Klopf G, CRNA   Sign clinical note

## 2017-02-13 NOTE — Progress Notes (Signed)
Subjective: POD# 2 Information for the patient's newborn:  [name] [name], [name] [name] [[id number]][[id number]]  female   NICU - prematurity, CPAP  Reports feeling well, walking to NICU w/o difficulty. Feeding: pumping breastmilk Patient reports tolerating PO.  Breast symptoms: + colostrum Pain controlled withacetaminophen and ibuprofen (OTC) Denies HA/SOB/C/P/N/V/dizziness. Flatus present. She reports vaginal bleeding as normal, without clots.  She is ambulating, urinating without difficulty.     Objective:   VS:    Vitals:   02/12/17 0800 02/12/17 1815 02/12/17 2338 02/13/17 0854  BP: (!) 115/57 122/63 (!) 122/58 (!) 113/51  Pulse: (!) 53 74 77 77  Resp: 18 20 18 17   Temp: 98.6 F (37 C) 98.2 F (36.8 C) 98.4 F (36.9 C) 98.2 F (36.8 C)  TempSrc: Oral Oral Oral Oral  SpO2: 100% 100% 100% 98%  Weight:      Height:          Intake/Output Summary (Last 24 hours) at 02/13/2017 0946 Last data filed at 02/12/2017 2352 Gross per 24 hour  Intake 240 ml  Output -  Net 240 ml        Recent Labs    02/10/17 2355 02/12/17 0524  WBC 18.8* 20.0*  HGB 12.0 10.0*  HCT 35.3* 29.1*  PLT 232 234     Blood type: --/--/A POS (11/08 2355)  Rubella: Immune (06/07 0000)     Physical Exam:  General: alert, cooperative and no distress Abdomen: soft, nontender, normal bowel sounds Incision: clean, dry and intact Uterine Fundus: firm, below umbilicus, nontender Lochia: minimal Ext: edema +1 pedal and pretibial and no cords or tenderness in calfs      Assessment/Plan: 21 y.o.   POD# 2. [id number]                  Principal Problem:   Cesarean delivery delivered / Indication: vasa previa 11/9 Active Problems:   Vasa previa   Postpartum care following cesarean delivery   Doing well, stable.    Routine post-op care Nettle/dandelion tea for edema and lactation. Anticipate DC in AM  Neta Mendsaniela C Ronak Duquette, CNM, MSN 02/13/2017, 9:46 AM

## 2017-02-13 NOTE — Lactation Note (Signed)
This note was copied from a baby's chart. Lactation Consultation Note  Patient Name: [name] [name] JXBJY'NToday's Date: 02/13/2017 Reason for consult: Follow-up assessment;NICU baby;Late-preterm 34-36.6wks Infant is 3160 hours old & mom seen in her room by Lactation for follow-up assessment. Mom reports she has been pumping q 2hrs for 20 mins and pumped 1 oz recently. Mom reports that she was pumping q 3hrs but stopped getting milk so increased it to q 2hrs. Mom reports she has not been doing any hand expression because she does not get any milk but will sometimes use the hand pump after the DEBP. Reviewed technique of hand expressing (mom had visitors & did not want to try now). Mom reports she has been using the Initiate phase on the DEBP so encouraged mom to switch to regular pumping for 15-20 mins. Reminded mom to pump 8-12x in 24hrs followed by hand expressing. [facility] Mom reports no questions at this time. Encouraged mom to ask for help as needed.  Maternal Data    Feeding    LATCH Score                   Interventions    Lactation Tools Discussed/Used WIC Program: Yes   Consult Status Consult Status: Follow-up Date: 02/14/17 Follow-up type: In-patient    [name] 02/13/2017, 8:55 PM

## 2017-02-14 ENCOUNTER — Ambulatory Visit: Payer: Self-pay

## 2017-02-14 DIAGNOSIS — Z2839 Other underimmunization status: Secondary | ICD-10-CM

## 2017-02-14 DIAGNOSIS — D62 Acute posthemorrhagic anemia: Secondary | ICD-10-CM

## 2017-02-14 DIAGNOSIS — O9989 Other specified diseases and conditions complicating pregnancy, childbirth and the puerperium: Secondary | ICD-10-CM

## 2017-02-14 DIAGNOSIS — Z283 Underimmunization status: Secondary | ICD-10-CM

## 2017-02-14 MED ORDER — IBUPROFEN 600 MG PO TABS
600.0000 mg | ORAL_TABLET | Freq: Four times a day (QID) | ORAL | 0 refills | Status: DC
Start: 2017-02-14 — End: 2018-08-04

## 2017-02-14 MED ORDER — FERROUS SULFATE 325 (65 FE) MG PO TABS: 325.0000 mg | ORAL_TABLET | Freq: Every day | ORAL | 3 refills | Status: DC

## 2017-02-14 MED ORDER — OXYCODONE-ACETAMINOPHEN 5-325 MG PO TABS: 1.0000 | ORAL_TABLET | ORAL | 0 refills | Status: DC | PRN

## 2017-02-14 MED ORDER — CEPHALEXIN 500 MG PO CAPS: 500.0000 mg | ORAL_CAPSULE | Freq: Two times a day (BID) | ORAL | 0 refills | Status: AC

## 2017-02-14 NOTE — Progress Notes (Signed)
POSTOPERATIVE DAY # 3 S/P Primary LTCS for Vasa Previa, baby boy "Joelene MillinOliver"   S:         Reports feeling okay, but tired             Tolerating po intake / no nausea / no vomiting / + flatus / + BM  C/o dysuria at the end of stream since yesterday with some irritation, reports some back pain last night, but none today  Denies dizziness, SOB, or CP             Bleeding is heavy, reports some large clots yesterday, but none otday              Pain controlled with Motrin and Percocet             Up ad lib / ambulatory/ voiding QS  Newborn in NICU on CPAP, doing well per mom. Mom pumping and states her milk supply is in. No concerns. / Circumcision  - planning prior to discharge   O:  VS: BP (!) 102/44 (BP Location: Right Arm)   Pulse 74   Temp 98 F (36.7 C) (Oral)   Resp 16   Ht 5\' 4"  (1.626 m)   Wt 93 kg (205 lb)   LMP 06/13/2016   SpO2 99%   Breastfeeding? Unknown   BMI 35.19 kg/m    LABS:               Recent Labs    02/12/17 0524  WBC 20.0*  HGB 10.0*  PLT 234               Bloodtype: --/--/A POS (11/08 2355)  Rubella: Immune (06/07 0000)                                             I&O: Intake/Output      11/11 0701 - 11/12 0700 11/12 0701 - 11/13 0700   P.O.     Total Intake(mL/kg)     Net                       Physical Exam:             Alert and Oriented X3  Lungs: Clear and unlabored  Heart: regular rate and rhythm / no murmurs  Abdomen: soft, non-tender, non-distended, active bowel sound in all quadrants             Fundus: firm, non-tender, U-3             Dressing: honeycomb dsg c/d/i              Incision:  approximated with sutures / no erythema / no ecchymosis / no drainage  Perineum: intact  Lochia: appropriate, scant on pad  Extremities: +2 BLE edema, no calf pain or tenderness  A:        POD # 1 S/P Primary LTCS for Vasa Previa             ABL Anemia - stable asymptomatic   Dependent edema   Dysuria   Doing well, stable  P:        Routine  postoperative care              Recommend increasing hydration, wearing TED hose, and Nettle/dandelion tea for edema   Clean catch urine culture today  for dysuria   Will send in rx for Keflex 500mg  BID for discharge  Ferrous sulfate daily   Discharge home today   WOB discharge book and instructions reviewed  F/u with me in 2 weeks for interval PP visit and EPDS   Carlean JewsMeredith Zoha Spranger, MSN, CNM [facility]

## 2017-02-14 NOTE — Progress Notes (Signed)
Pt discharged with printed instructions. Pt verbalized an understanding. No concerns noted. Delonte Musich L Roxy Filler, RN 

## 2017-02-14 NOTE — Lactation Note (Signed)
This note was copied from a baby's chart. Lactation Consultation Note  Patient Name: [name] Vickii PennaDemi Tufano QMVHQ'IToday's Date: 02/14/2017 Reason for consult: Follow-up assessment;NICU baby;Late-preterm 34-36.6wks;Other (Comment)(per mom milk in )  Baby is 1076 hours old  Mom ready for D/C and according to the LC's note from last night has her WIC DEBP.  Per mom pumping off 30 ml each breast this am, and felt relief , mom denies sore nipples Sore nipple and engorgement prevention and tx reviewed.  LC mentioned to mom to plan on pumping while visiting the baby in NICU to enhance milk supply.  Mother informed of post-discharge support and given phone number to the lactation department, including services  for phone call assistance; out-patient appointments; and breastfeeding support group. List of other breastfeeding resources in the community given in the handout. Encouraged mother to call for problems or concerns related to breastfeeding.   Maternal Data    Feeding    LATCH Score                   Interventions Interventions: Breast feeding basics reviewed  Lactation Tools Discussed/Used     Consult Status Consult Status: PRN Follow-up type: Other (comment)(baby in NICU )    [name] [name] 02/14/2017, 12:17 PM

## 2017-02-14 NOTE — Discharge Summary (Signed)
Obstetric Discharge Summary   Patient Name: [name] [name] DOB: 10/24/1995 MRN: [id number]  Date of Admission: 01/23/2017 Date of Discharge: 02/14/2017 Date of Delivery: 02/11/17 Gestational Age at Delivery: 6981w5d  [facility]  Antepartum complications:  - Vasa Previa  - Marginal cord insertion - Rubella Non-immune - Glucose intolerance, normal 3hr GTT  Prenatal Labs:  ABO/RH: A Pos Antibody: Negative RPR: NR Hep B: Negative GBS: none 1 hr GTT: elevated, 3 hr GTT: WNL Rubella: Non-immune  GC/Chlamydia: negative AFP-4/Quad: negative   Admitting Diagnosis: 32 weeks for vasa previa   Secondary Diagnoses: Patient Active Problem List   Diagnosis Date Noted  . Acute blood loss anemia 02/14/2017  . Rubella non-immune status, antepartum 02/14/2017  . Cesarean delivery delivered / Indication: vasa previa 11/9 02/12/2017  . Postpartum care following cesarean delivery (11/9) 02/12/2017  . Vasa previa 01/13/2017    Date of Delivery: 02/11/2017 Delivered By: Dr. Billy Coastaavon/ D. Renae FicklePaul, CNM assist Delivery Type: primary cesarean section, low transverse incision  Newborn Data: Live born female  Birth Weight: 5 lb 10 oz (2550 g) APGAR: 8, 9  Newborn Delivery   Birth date/time:  02/11/2017 07:57:00 Delivery type:  C-Section, Low Transverse C-section categorization:  Primary      Postpartum Course  (Cesarean Section):  Hospital course: Pt. Admitted to [facility] at 32 weeks for vasa previa and occasional uterine contractions requiring Procardia.  Primary LTCS performed at 34+[redacted] weeks gestation per MFM recommendations.  Infant stable to NICU for prematurity. Patient had an uncomplicated postpartum course.  By time of discharge on POD#3, her pain was controlled on oral pain medications; she had appropriate lochia and was ambulating, voiding without difficulty, tolerating regular diet and passing flatus.   She was deemed stable for discharge to home.     Labs: CBC Latest  Ref Rng & Units 02/12/2017 02/10/2017 01/23/2017  WBC 4.0 - 10.5 K/uL 20.0(H) 18.8(H) 13.2(H)  Hemoglobin 12.0 - 15.0 g/dL 10.0(L) 12.0 12.2  Hematocrit 36.0 - 46.0 % 29.1(L) 35.3(L) 36.3  Platelets 150 - 400 K/uL 234 232 285   A POS  Physical exam:  BP (!) 102/44 (BP Location: Right Arm)   Pulse 74   Temp 98 F (36.7 C) (Oral)   Resp 16   Ht 5\' 4"  (1.626 m)   Wt 93 kg (205 lb)   LMP 06/13/2016   SpO2 99%   Breastfeeding? Unknown   BMI 35.19 kg/m  General: alert and no distress Pulm: normal respiratory effort Lochia: appropriate Abdomen: soft, NT Uterine Fundus: firm, below umbilicus Perineum: healing well, no significant erythema, no significant edema Incision: c/d/i, healing well, no significant drainage, no dehiscence, no significant erythema Extremities: No evidence of DVT seen on physical exam. No lower extremity edema.  Disposition: stable, discharge to home Baby Feeding: breast milk  Baby Disposition: NICU for prematurity requiring CPAP  Contraception: unsure  Rh Immune globulin given: N/A Rubella vaccine given: offered PP - Declined  Tdap vaccine given in AP or PP setting: Declined Flu vaccine given in AP or PP setting: Declined   Plan:  [name] [name] was discharged to home in good condition. [facility].  Discharge Instructions: Per After Visit Summary. Activity: Advance as tolerated. Pelvic rest for 6 weeks.  Refer to After Visit Summary Diet: Regular, Heart Healthy Discharge Medications: Allergies as of 02/14/2017   No Known Allergies     Medication List    STOP taking these medications  albuterol (2.5 MG/3ML) 0.083% nebulizer solution Commonly known as:  PROVENTIL   azithromycin 250 MG tablet Commonly known as:  ZITHROMAX Z-PAK   chlorpheniramine-HYDROcodone 10-8 MG/5ML Suer Commonly known as:  TUSSIONEX PENNKINETIC ER   NIFEdipine 30 MG 24 hr tablet Commonly known as:   PROCARDIA-XL/ADALAT-CC/NIFEDICAL-XL     TAKE these medications   cephALEXin 500 MG capsule Commonly known as:  KEFLEX Take 1 capsule (500 mg total) 2 (two) times daily for 7 days by mouth.   ferrous sulfate 325 (65 FE) MG tablet Take 1 tablet (325 mg total) daily by mouth.   ibuprofen 600 MG tablet Commonly known as:  ADVIL,MOTRIN Take 1 tablet (600 mg total) every 6 (six) hours by mouth.   oxyCODONE-acetaminophen 5-325 MG tablet Commonly known as:  PERCOCET/ROXICET Take 1 tablet every 4 (four) hours as needed by mouth (pain scale 4-7).   prenatal multivitamin Tabs tablet Take 1 tablet by mouth daily at 12 noon.      Outpatient follow up:  Follow-up Information    Karena AddisonSigmon, Meredith C, CNM. Schedule an appointment as soon as possible for a visit in 2 week(s).   Specialty:  Obstetrics and Gynecology Why:  Interval postpartum visit  Contact information: [address] [place] [address] [phone]           Signed:  Carlean JewsMeredith Sigmon, MSN, CNM [facility]

## 2017-02-15 LAB — CULTURE, OB URINE: SPECIAL REQUESTS: NORMAL

## 2017-02-21 DIAGNOSIS — Z9889 Other specified postprocedural states: Secondary | ICD-10-CM | POA: Diagnosis not present

## 2017-02-22 ENCOUNTER — Ambulatory Visit: Payer: Self-pay

## 2017-02-22 NOTE — Lactation Note (Signed)
This note was copied from a baby's chart. Lactation Consultation Note  Patient Name: Boy Vickii PennaDemi Wellman WUJWJ'XToday's Date: 02/22/2017 Reason for consult: Follow-up assessment;NICU baby  NICU baby 1311 days old. Parents report that this is the first time baby has been latched to breast. Demonstrated to mom how to turn baby, chest-to-chest with her, so that baby able to latch without turning his head. Demonstrated how to sandwich breast and enc baby to latch. However, baby sleepy at breast and could not stimulate to latch. Discussed infant behavior and enc latching as often as she and baby able. Mom reports that she has been pumping every 2 hours. Discussed pumping every 2-3 hours for a total of 8-12 times/24 hours. Mom reports that she is getting 2-4 ounces when she pumps.   Maternal Data    Feeding Feeding Type: Breast Fed Length of feed: 30 min  LATCH Score Latch: Too sleepy or reluctant, no latch achieved, no sucking elicited.  Audible Swallowing: None  Type of Nipple: Everted at rest and after stimulation  Comfort (Breast/Nipple): Soft / non-tender  Hold (Positioning): Assistance needed to correctly position infant at breast and maintain latch.  LATCH Score: 5  Interventions Interventions: Breast feeding basics reviewed;Assisted with latch;Skin to skin;Breast compression;Adjust position;Support pillows;Position options;Expressed milk  Lactation Tools Discussed/Used     Consult Status Consult Status: PRN    Sherlyn HayJennifer D Kailei Cowens 02/22/2017, 2:36 PM

## 2017-02-26 ENCOUNTER — Encounter (HOSPITAL_COMMUNITY): Payer: Self-pay | Admitting: *Deleted

## 2017-02-26 ENCOUNTER — Inpatient Hospital Stay (HOSPITAL_COMMUNITY)
Admission: AD | Admit: 2017-02-26 | Discharge: 2017-02-26 | Disposition: A | Discharge status: Home / Self Care | Payer: Medicaid Other | Source: Ambulatory Visit | Attending: Obstetrics and Gynecology | Admitting: Obstetrics and Gynecology

## 2017-02-26 DIAGNOSIS — O8602 Infection of obstetric surgical wound, deep incisional site: Secondary | ICD-10-CM

## 2017-02-26 DIAGNOSIS — T8149XA Infection following a procedure, other surgical site, initial encounter: Secondary | ICD-10-CM

## 2017-02-26 DIAGNOSIS — Z9889 Other specified postprocedural states: Secondary | ICD-10-CM | POA: Diagnosis not present

## 2017-02-26 DIAGNOSIS — Z810 Family history of intellectual disabilities: Secondary | ICD-10-CM | POA: Insufficient documentation

## 2017-02-26 DIAGNOSIS — Z8249 Family history of ischemic heart disease and other diseases of the circulatory system: Secondary | ICD-10-CM | POA: Diagnosis not present

## 2017-02-26 DIAGNOSIS — Z98891 History of uterine scar from previous surgery: Secondary | ICD-10-CM

## 2017-02-26 DIAGNOSIS — O8600 Infection of obstetric surgical wound, unspecified: Secondary | ICD-10-CM | POA: Insufficient documentation

## 2017-02-26 MED ORDER — CEPHALEXIN 500 MG PO CAPS: 500.0000 mg | ORAL_CAPSULE | Freq: Four times a day (QID) | ORAL | 0 refills | Status: DC

## 2017-02-26 NOTE — MAU Provider Note (Signed)
History     CSN: [id number]  Arrival date and time: 02/26/17 2108   First Provider Initiated Contact with Patient 02/26/17 2153      Chief Complaint  Patient presents with  . Drainage from Incision  . Wound Check   [name] [name] is a 21 y.o.  G1P0101 who is SP c-section on 02/11/17. She was seen in the office on 11/19, and states that "they noticed a spot, and asked me if it hurt". It did not hurt at that time, but it started to hurt about 2 days later. She has not called the office since the pain started.    Wound Check  She was originally treated more than 14 days ago. Prior ED Treatment: c-section  The maximum temperature noted was less than 100.4 F. The temperature was taken using an oral thermometer. There has been colored ("some pus") discharge from the wound. There is new redness present. There is new swelling present. There is new pain (4/10 ) present.   Past Medical History:  Diagnosis Date  . Headache   . Medical history non-contributory     Past Surgical History:  Procedure Laterality Date  . CESAREAN SECTION N/A 02/11/2017   Procedure: Primary CESAREAN SECTION;  Surgeon: Olivia Mackieaavon, Richard, MD;  [facility];  Service: Obstetrics;  Laterality: N/A;  EDD: 03/20/17  . NO PAST SURGERIES      Family History  Problem Relation Age of Onset  . Heart disease Mother   . Hypertension Mother [id number]       PULMONARY ARTERIAL HYPERTENSION  . Mental retardation Maternal Uncle     Social History   Tobacco Use  . Smoking status: Never Smoker  . Smokeless tobacco: Never Used  Substance Use Topics  . Alcohol use: No  . Drug use: No    Allergies: No Known Allergies  Medications Prior to Admission  Medication Sig Dispense Refill Last Dose  . ferrous sulfate 325 (65 FE) MG tablet Take 1 tablet (325 mg total) daily by mouth. 30 tablet 3   . ibuprofen (ADVIL,MOTRIN) 600 MG tablet Take 1 tablet (600 mg total) every 6 (six) hours by mouth. 30 tablet 0   .  oxyCODONE-acetaminophen (PERCOCET/ROXICET) 5-325 MG tablet Take 1 tablet every 4 (four) hours as needed by mouth (pain scale 4-7). 30 tablet 0   . Prenatal Vit-Fe Fumarate-FA (PRENATAL MULTIVITAMIN) TABS tablet Take 1 tablet by mouth daily at 12 noon.   01/22/2017 at Unknown time    Review of Systems  Constitutional: Negative for chills and fever.  Gastrointestinal: Negative for diarrhea and vomiting.  Genitourinary: Negative for pelvic pain.   Physical Exam   Blood pressure 124/76, pulse 93, temperature 98.4 F (36.9 C), temperature source Oral, resp. rate 18, unknown if currently breastfeeding.  Physical Exam  Nursing note and vitals reviewed. Constitutional: She is oriented to person, place, and time. She appears well-developed and well-nourished. No distress.  HENT:  Head: Normocephalic.  Cardiovascular: Normal rate.  Respiratory: Effort normal.  GI: Soft. There is no tenderness. There is no rebound.  c-section incision with about 3cm area of redness on the left side. Non-tender. I am not able to express any drainage from the site.   Neurological: She is alert and oriented to person, place, and time.  Skin: Skin is warm and dry.  Psychiatric: She has a normal mood and affect.    MAU Course  Procedures  MDM 2203: DW. [place], ok for DC home will start on Keflex  QID. FU in the office on Tuesday.   Assessment and Plan   1. Wound infection after surgery   2. S/P cesarean section    DC home Comfort measures reviewed  RX: kelfex QID x 7 days  Return to MAU as needed FU with OB as planned  Follow-up Information    [facility].   Why:  Call Monday morning for an appointment. They would like you to be seen on Tuesday.  Contact information: [address] [place] [address] [phone]            Thressa ShellerHeather Trevor [name] 02/26/2017, 9:54 PM

## 2017-02-26 NOTE — Discharge Instructions (Signed)
Surgical Site Infections FAQs What is a Surgical Site Infection (SSI)? A surgical site infection is an infection that occurs after surgery in the part of the body where the surgery took place. Most patients who have surgery do not develop an infection. However, infections develop in about 1 to 3 out of every 100 patients who have surgery. Some of the common symptoms of a surgical site infection are:  Redness and pain around the area where you had surgery  Drainage of cloudy fluid from your surgical wound  Fever  Can SSIs be treated? Yes. Most surgical site infections can be treated with antibiotics. The antibiotic given to you depends on the bacteria (germs) causing the infection. Sometimes patients with SSIs also need another surgery to treat the infection. What are some of the things that hospitals are doing to prevent SSIs? To prevent SSIs, doctors, nurses, and other healthcare providers:  Clean their hands and arms up to their elbows with an antiseptic agent just before the surgery.  Clean their hands with soap and water or an alcohol-based hand rub before and after caring for each patient.  May remove some of your hair immediately before your surgery using electric clippers if the hair is in the same area where the procedure will occur. They should not shave you with a razor.  Wear special hair covers, masks, gowns, and gloves during surgery to keep the surgery area clean.  Give you antibiotics before your surgery starts. In most cases, you should get antibiotics within 60 minutes before the surgery starts and the antibiotics should be stopped within 24 hours after surgery.  Clean the skin at the site of your surgery with a special soap that kills germs.  What can I do to help prevent SSIs?Before your surgery:  Tell your doctor about other medical problems you may have. Health problems such as allergies, diabetes, and obesity could affect your surgery and your treatment.  Quit  smoking. Patients who smoke get more infections. Talk to your doctor about how you can quit before your surgery.  Do not shave near where you will have surgery. Shaving with a razor can irritate your skin and make it easier to develop an infection. At the time of your surgery:  Speak up if someone tries to shave you with a razor before surgery. Ask why you need to be shaved and talk with your surgeon if you have any concerns.  Ask if you will get antibiotics before surgery. After your surgery:  Make sure that your healthcare providers clean their hands before examining you, either with soap and water or an alcohol-based hand rub. ? If you do not see your providers clean their hands, please ask them to do so.  Family and friends who visit you should not touch the surgical wound or dressings.  Family and friends should clean their hands with soap and water or an alcohol-based hand rub before and after visiting you. If you do not see them clean their hands, ask them to clean their hands. What do I need to do when I go home from the hospital?  Before you go home, your doctor or nurse should explain everything you need to know about taking care of your wound. Make sure you understand how to care for your wound before you leave the hospital.  Always clean your hands before and after caring for your wound.  Before you go home, make sure you know who to contact if you have questions or problems  after you get home.  If you have any symptoms of an infection, such as redness and pain at the surgery site, drainage, or fever, call your doctor immediately. If you have additional questions, please ask your doctor or nurse. Developed and co-sponsored by [employer] Society for [employer] of [place] [phone](SHEA); Infectious Diseases Society of America (IDSA); [facility] Association; Association for Professionals in Infection Control and Epidemiology (APIC); Centers for Disease Control and Prevention  (CDC); and [employer] Commission. This information is not intended to replace advice given to you by your health care provider. Make sure you discuss any questions you have with your health care provider. Document Released: 03/27/2013 Document Revised: 02/18/2016 Document Reviewed: 08/11/2015 Elsevier Interactive Patient Education  2017 [employer]

## 2017-02-26 NOTE — MAU Note (Signed)
Pt reports she noticed some puss draining from her c-section incision 3 days ago. Slightly reed an increase in pain. No fever noted.

## 2017-03-04 ENCOUNTER — Inpatient Hospital Stay (HOSPITAL_COMMUNITY): Payer: Medicaid Other

## 2017-03-04 ENCOUNTER — Encounter (HOSPITAL_COMMUNITY): Payer: Self-pay | Admitting: *Deleted

## 2017-03-04 ENCOUNTER — Inpatient Hospital Stay (HOSPITAL_COMMUNITY)
Admission: AD | Admit: 2017-03-04 | Discharge: 2017-03-04 | Disposition: A | Discharge status: Home / Self Care | Payer: Medicaid Other | Source: Ambulatory Visit | Attending: Obstetrics and Gynecology | Admitting: Obstetrics and Gynecology

## 2017-03-04 DIAGNOSIS — R1011 Right upper quadrant pain: Secondary | ICD-10-CM | POA: Insufficient documentation

## 2017-03-04 DIAGNOSIS — K8020 Calculus of gallbladder without cholecystitis without obstruction: Secondary | ICD-10-CM | POA: Diagnosis not present

## 2017-03-04 DIAGNOSIS — R1013 Epigastric pain: Secondary | ICD-10-CM | POA: Diagnosis not present

## 2017-03-04 DIAGNOSIS — Z79899 Other long term (current) drug therapy: Secondary | ICD-10-CM | POA: Insufficient documentation

## 2017-03-04 DIAGNOSIS — O9089 Other complications of the puerperium, not elsewhere classified: Secondary | ICD-10-CM | POA: Insufficient documentation

## 2017-03-04 LAB — PROTEIN / CREATININE RATIO, URINE
CREATININE, URINE: 149 mg/dL
Total Protein, Urine: <6 mg/dL

## 2017-03-04 LAB — URINALYSIS, ROUTINE W REFLEX MICROSCOPIC
BACTERIA UA: NONE SEEN
Bilirubin Urine: NEGATIVE
GLUCOSE, UA: NEGATIVE mg/dL
Hgb urine dipstick: NEGATIVE
KETONES UR: NEGATIVE mg/dL
Nitrite: NEGATIVE
PROTEIN: NEGATIVE mg/dL
Specific Gravity, Urine: 1.021 (ref 1.005–1.030)
pH: 5 (ref 5.0–8.0)

## 2017-03-04 LAB — COMPREHENSIVE METABOLIC PANEL
ALT: 26 U/L (ref 14–54)
AST: 28 U/L (ref 15–41)
Albumin: 3.8 g/dL (ref 3.5–5.0)
Alkaline Phosphatase: 107 U/L (ref 38–126)
Anion gap: 9 (ref 5–15)
BUN: 18 mg/dL (ref 6–20)
CALCIUM: 9.2 mg/dL (ref 8.9–10.3)
CO2: 27 mmol/L (ref 22–32)
CREATININE: 0.65 mg/dL (ref 0.44–1.00)
Chloride: 104 mmol/L (ref 101–111)
GFR calc Af Amer: >60 mL/min (ref >60)
GLUCOSE: 116 mg/dL — AB (ref 65–99)
POTASSIUM: 3.4 mmol/L — AB (ref 3.5–5.1)
SODIUM: 140 mmol/L (ref 135–145)
TOTAL PROTEIN: 7.3 g/dL (ref 6.5–8.1)
Total Bilirubin: 0.4 mg/dL (ref 0.3–1.2)

## 2017-03-04 LAB — CBC
HCT: 40.5 % (ref 36.0–46.0)
Hemoglobin: 13 g/dL (ref 12.0–15.0)
MCH: 28.6 pg (ref 26.0–34.0)
MCHC: 32.1 g/dL (ref 30.0–36.0)
MCV: 89 fL (ref 78.0–100.0)
PLATELETS: 381 10*3/uL (ref 150–400)
RBC: 4.55 MIL/uL (ref 3.87–5.11)
RDW: 13 % (ref 11.5–15.5)
WBC: 13.1 10*3/uL — ABNORMAL HIGH (ref 4.0–10.5)

## 2017-03-04 NOTE — Discharge Instructions (Signed)
Low-Fat Diet for Pancreatitis or Gallbladder Conditions A low-fat diet can be helpful if you have pancreatitis or a gallbladder condition. With these conditions, your pancreas and gallbladder have trouble digesting fats. A healthy eating plan with less fat will help rest your pancreas and gallbladder and reduce your symptoms. What do I need to know about this diet?  Eat a low-fat diet. ? Reduce your fat intake to less than 20-30% of your total daily calories. This is less than 50-60 g of fat per day. ? Remember that you need some fat in your diet. Ask your dietician what your daily goal should be. ? Choose nonfat and low-fat healthy foods. Look for the words "nonfat," "low fat," or "fat free." ? As a guide, look on the label and choose foods with less than 3 g of fat per serving. Eat only one serving.  Avoid alcohol.  Do not smoke. If you need help quitting, talk with your health care provider.  Eat small frequent meals instead of three large heavy meals. What foods can I eat? Grains Include healthy grains and starches such as potatoes, wheat bread, fiber-rich cereal, and brown rice. Choose whole grain options whenever possible. In adults, whole grains should account for 45-65% of your daily calories. Fruits and Vegetables Eat plenty of fruits and vegetables. Fresh fruits and vegetables add fiber to your diet. Meats and Other Protein Sources Eat lean meat such as chicken and pork. Trim any fat off of meat before cooking it. Eggs, fish, and beans are other sources of protein. In adults, these foods should account for 10-35% of your daily calories. Dairy Choose low-fat milk and dairy options. Dairy includes fat and protein, as well as calcium. Fats and Oils Limit high-fat foods such as fried foods, sweets, baked goods, sugary drinks. Other Creamy sauces and condiments, such as mayonnaise, can add extra fat. Think about whether or not you need to use them, or use smaller amounts or low fat  options. What foods are not recommended?  High fat foods, such as: ? Baked goods. ? Ice cream. ? French toast. ? Sweet rolls. ? Pizza. ? Cheese bread. ? Foods covered with batter, butter, creamy sauces, or cheese. ? Fried foods. ? Sugary drinks and desserts.  Foods that cause gas or bloating This information is not intended to replace advice given to you by your health care provider. Make sure you discuss any questions you have with your health care provider. Document Released: 03/27/2013 Document Revised: 08/28/2015 Document Reviewed: 03/05/2013 Elsevier Interactive Patient Education  2017 Elsevier Inc.  

## 2017-03-04 NOTE — MAU Provider Note (Signed)
History     CSN: [id number]  Arrival date and time: 03/04/17 [id number]   First Provider Initiated Contact with Patient 03/04/17 0503      No chief complaint on file.  [name] [name] is a 21 y.o. G1P0101 who is 3 weeks S/P c-section for vasa previa. She was seen last week and started on antibiotics for skin infection at the c-section site. She is here today with epigastric and RUQ pain. She did not have pre-eclampsia with this pregnancy. She was delivered at 34 weeks for vasa previa.  Abdominal Pain  This is a new problem. The current episode started today (around 0300). The onset quality is sudden. The problem occurs constantly. The problem has been gradually improving. The pain is located in the RUQ and epigastric region. The pain is at a severity of 10/10. The quality of the pain is sharp. The abdominal pain radiates to the back. Associated symptoms include nausea. Pertinent negatives include no fever, headaches or vomiting. Nothing aggravates the pain. The pain is relieved by passing flatus. She has tried nothing for the symptoms.   Past Medical History:  Diagnosis Date  . Headache   . Medical history non-contributory     Past Surgical History:  Procedure Laterality Date  . CESAREAN SECTION N/A 02/11/2017   Procedure: Primary CESAREAN SECTION;  Surgeon: Olivia Mackieaavon, Richard, MD;  [facility];  Service: Obstetrics;  Laterality: N/A;  EDD: 03/20/17  . NO PAST SURGERIES      Family History  Problem Relation Age of Onset  . Heart disease Mother   . Hypertension Mother [id number]       PULMONARY ARTERIAL HYPERTENSION  . Mental retardation Maternal Uncle     Social History   Tobacco Use  . Smoking status: Never Smoker  . Smokeless tobacco: Never Used  Substance Use Topics  . Alcohol use: No  . Drug use: No    Allergies: No Known Allergies  Medications Prior to Admission  Medication Sig Dispense Refill Last Dose  . cephALEXin (KEFLEX) 500 MG capsule Take 1 capsule (500  mg total) by mouth 4 (four) times daily. 28 capsule 0   . ferrous sulfate 325 (65 FE) MG tablet Take 1 tablet (325 mg total) daily by mouth. 30 tablet 3   . ibuprofen (ADVIL,MOTRIN) 600 MG tablet Take 1 tablet (600 mg total) every 6 (six) hours by mouth. 30 tablet 0   . oxyCODONE-acetaminophen (PERCOCET/ROXICET) 5-325 MG tablet Take 1 tablet every 4 (four) hours as needed by mouth (pain scale 4-7). 30 tablet 0   . Prenatal Vit-Fe Fumarate-FA (PRENATAL MULTIVITAMIN) TABS tablet Take 1 tablet by mouth daily at 12 noon.   01/22/2017 at Unknown time    Review of Systems  Constitutional: Negative for chills and fever.  Eyes: Negative for visual disturbance.  Gastrointestinal: Positive for abdominal pain and nausea. Negative for vomiting.  Genitourinary: Negative for pelvic pain.  Neurological: Negative for headaches.   Physical Exam   Blood pressure (!) 147/86, pulse 82, temperature (!) 97.4 F (36.3 C), temperature source Oral, resp. rate (!) 24, SpO2 100 %, unknown if currently breastfeeding.  Physical Exam  Nursing note and vitals reviewed. Constitutional: She is oriented to person, place, and time. She appears well-developed and well-nourished. No distress.  HENT:  Head: Normocephalic.  Cardiovascular: Normal rate.  Respiratory: Effort normal.  GI: Soft. There is no tenderness. There is no rebound.  Neurological: She is alert and oriented to person, place, and time.  Skin: Skin  is warm and dry.  Psychiatric: She has a normal mood and affect.   Results for orders placed or performed during the hospital encounter of 03/04/17 (from the past 24 hour(s))  CBC     Status: Abnormal   Collection Time: 03/04/17  4:51 AM  Result Value Ref Range   WBC 13.1 (H) 4.0 - 10.5 K/uL   RBC 4.55 3.87 - 5.11 MIL/uL   Hemoglobin 13.0 12.0 - 15.0 g/dL   HCT 16.1 09.6 - 04.5 %   MCV 89.0 78.0 - 100.0 fL   MCH 28.6 26.0 - 34.0 pg   MCHC 32.1 30.0 - 36.0 g/dL   RDW 40.9 81.1 - 91.4 %   Platelets 381  150 - 400 K/uL  Comprehensive metabolic panel     Status: Abnormal   Collection Time: 03/04/17  4:51 AM  Result Value Ref Range   Sodium 140 135 - 145 mmol/L   Potassium 3.4 (L) 3.5 - 5.1 mmol/L   Chloride 104 101 - 111 mmol/L   CO2 27 22 - 32 mmol/L   Glucose, Bld 116 (H) 65 - 99 mg/dL   BUN 18 6 - 20 mg/dL   Creatinine, Ser 7.82 0.44 - 1.00 mg/dL   Calcium 9.2 8.9 - 95.6 mg/dL   Total Protein 7.3 6.5 - 8.1 g/dL   Albumin 3.8 3.5 - 5.0 g/dL   AST 28 15 - 41 U/L   ALT 26 14 - 54 U/L   Alkaline Phosphatase 107 38 - 126 U/L   Total Bilirubin 0.4 0.3 - 1.2 mg/dL   GFR calc non Af Amer >60 >60 mL/min   GFR calc Af Amer >60 >60 mL/min   Anion gap 9 5 - 15  Protein / creatinine ratio, urine     Status: None   Collection Time: 03/04/17  5:05 AM  Result Value Ref Range   Creatinine, Urine 149.00 mg/dL   Total Protein, Urine <6 mg/dL   Protein Creatinine Ratio        0.00 - 0.15 mg/mg[Cre]  Urinalysis, Routine w reflex microscopic     Status: Abnormal   Collection Time: 03/04/17  5:05 AM  Result Value Ref Range   Color, Urine YELLOW YELLOW   APPearance HAZY (A) CLEAR   Specific Gravity, Urine 1.021 1.005 - 1.030   pH 5.0 5.0 - 8.0   Glucose, UA NEGATIVE NEGATIVE mg/dL   Hgb urine dipstick NEGATIVE NEGATIVE   Bilirubin Urine NEGATIVE NEGATIVE   Ketones, ur NEGATIVE NEGATIVE mg/dL   Protein, ur NEGATIVE NEGATIVE mg/dL   Nitrite NEGATIVE NEGATIVE   Leukocytes, UA TRACE (A) NEGATIVE   RBC / HPF 0-5 0 - 5 RBC/hpf   WBC, UA 0-5 0 - 5 WBC/hpf   Bacteria, UA NONE SEEN NONE SEEN   Squamous Epithelial / LPF 0-5 (A) NONE SEEN   Mucus PRESENT    Results for orders placed or performed during the hospital encounter of 03/04/17 (from the past 24 hour(s))  CBC     Status: Abnormal   Collection Time: 03/04/17  4:51 AM  Result Value Ref Range   WBC 13.1 (H) 4.0 - 10.5 K/uL   RBC 4.55 3.87 - 5.11 MIL/uL   Hemoglobin 13.0 12.0 - 15.0 g/dL   HCT 21.3 08.6 - 57.8 %   MCV 89.0 78.0 - 100.0 fL    MCH 28.6 26.0 - 34.0 pg   MCHC 32.1 30.0 - 36.0 g/dL   RDW 46.9 62.9 - 52.8 %   Platelets  381 150 - 400 K/uL  Comprehensive metabolic panel     Status: Abnormal   Collection Time: 03/04/17  4:51 AM  Result Value Ref Range   Sodium 140 135 - 145 mmol/L   Potassium 3.4 (L) 3.5 - 5.1 mmol/L   Chloride 104 101 - 111 mmol/L   CO2 27 22 - 32 mmol/L   Glucose, Bld 116 (H) 65 - 99 mg/dL   BUN 18 6 - 20 mg/dL   Creatinine, Ser 1.610.65 0.44 - 1.00 mg/dL   Calcium 9.2 8.9 - 09.610.3 mg/dL   Total Protein 7.3 6.5 - 8.1 g/dL   Albumin 3.8 3.5 - 5.0 g/dL   AST 28 15 - 41 U/L   ALT 26 14 - 54 U/L   Alkaline Phosphatase 107 38 - 126 U/L   Total Bilirubin 0.4 0.3 - 1.2 mg/dL   GFR calc non Af Amer >60 >60 mL/min   GFR calc Af Amer >60 >60 mL/min   Anion gap 9 5 - 15  Protein / creatinine ratio, urine     Status: None   Collection Time: 03/04/17  5:05 AM  Result Value Ref Range   Creatinine, Urine 149.00 mg/dL   Total Protein, Urine <6 mg/dL   Protein Creatinine Ratio        0.00 - 0.15 mg/mg[Cre]  Urinalysis, Routine w reflex microscopic     Status: Abnormal   Collection Time: 03/04/17  5:05 AM  Result Value Ref Range   Color, Urine YELLOW YELLOW   APPearance HAZY (A) CLEAR   Specific Gravity, Urine 1.021 1.005 - 1.030   pH 5.0 5.0 - 8.0   Glucose, UA NEGATIVE NEGATIVE mg/dL   Hgb urine dipstick NEGATIVE NEGATIVE   Bilirubin Urine NEGATIVE NEGATIVE   Ketones, ur NEGATIVE NEGATIVE mg/dL   Protein, ur NEGATIVE NEGATIVE mg/dL   Nitrite NEGATIVE NEGATIVE   Leukocytes, UA TRACE (A) NEGATIVE   RBC / HPF 0-5 0 - 5 RBC/hpf   WBC, UA 0-5 0 - 5 WBC/hpf   Bacteria, UA NONE SEEN NONE SEEN   Squamous Epithelial / LPF 0-5 (A) NONE SEEN   Mucus PRESENT    [place] Abdomen Complete  Result Date: 03/04/2017 CLINICAL DATA:  Acute right upper quadrant pain. Recent Caesarean section. EXAM: ABDOMEN ULTRASOUND COMPLETE COMPARISON:  None. FINDINGS: Gallbladder: Physiologically distended. Multiple shadowing  gallstones in the gallbladder neck. No gallbladder wall thickening or pericholecystic fluid. No sonographic Murphy sign noted by sonographer. Common bile duct: Diameter: 4 mm, normal. Liver: No focal lesion identified. Within normal limits in parenchymal echogenicity. Portal vein is patent on color Doppler imaging with normal direction of blood flow towards the liver. IVC: No abnormality visualized. Pancreas: Visualized portion unremarkable. Spleen: Size and appearance within normal limits. Right Kidney: Length: 11.3 cm. Echogenicity within normal limits. No mass or hydronephrosis visualized. Left Kidney: Length: 11.6 cm. Echogenicity within normal limits. No mass or hydronephrosis visualized. Abdominal aorta: No aneurysm visualized. Other findings: None.  No ascites. IMPRESSION: 1. Cholelithiasis without sonographic findings of acute cholecystitis. 2. Otherwise unremarkable abdominal ultrasound. Electronically Signed   By: Rubye OaksMelanie  Ehinger M.D.   On: 03/04/2017 06:20   MAU Course  Procedures  MDM Patient reports that her pain is improving since being here. No medications have been given.   Reviewed US results with the patient. Low fat diet recommendations. She has some pain medication from her c-section that she didn't take that is available as needed for pain.   [id number]: DW Dr. Ernestina PennaFogleman, ok  for DC home.   Assessment and Plan   1. Calculus of gallbladder without cholecystitis without obstruction   2. Postpartum state   3. RUQ pain    DC home Comfort measures reviewed  Low fat diet, FU with general surgery PRN RX: none  Return to MAU as needed   Follow-up Information    Sigmon, Scarlette Slice, CNM Follow up.   Specialty:  Obstetrics and Gynecology Contact information: [address] [place] [address] [phone]            Thressa Sheller 03/04/2017, 5:08 AM

## 2017-03-04 NOTE — MAU Note (Signed)
PT HAS BURPED SEVERAL TIMES  AND SAYS SHE FEELS BETTER.

## 2017-03-04 NOTE — MAU Note (Addendum)
PT  HAS ARRIVED  - REMOVED FROM CAR  WITH W/C -   SAYS  SHE STARTED HAVING RIGHT UPPER SIDE STARTED HURTING AT 0330.  THIS HAPPENED IN OCT-  WAS TOLD IT WAS HEARTBURN .     HAD C/S ON 02-11-2017 BY   DR Billy CoastAAVON

## 2017-05-01 ENCOUNTER — Encounter (HOSPITAL_COMMUNITY): Payer: Self-pay

## 2017-05-01 DIAGNOSIS — Z5321 Procedure and treatment not carried out due to patient leaving prior to being seen by health care provider: Secondary | ICD-10-CM | POA: Insufficient documentation

## 2017-05-01 DIAGNOSIS — R109 Unspecified abdominal pain: Secondary | ICD-10-CM | POA: Diagnosis not present

## 2017-05-01 LAB — URINALYSIS, ROUTINE W REFLEX MICROSCOPIC
Bilirubin Urine: NEGATIVE
GLUCOSE, UA: NEGATIVE mg/dL
HGB URINE DIPSTICK: NEGATIVE
Ketones, ur: NEGATIVE mg/dL
LEUKOCYTES UA: NEGATIVE
Nitrite: NEGATIVE
Protein, ur: NEGATIVE mg/dL
SPECIFIC GRAVITY, URINE: 1.006 (ref 1.005–1.030)
pH: 7 (ref 5.0–8.0)

## 2017-05-01 LAB — COMPREHENSIVE METABOLIC PANEL
ALT: 33 U/L (ref 14–54)
AST: 29 U/L (ref 15–41)
Albumin: 3.8 g/dL (ref 3.5–5.0)
Alkaline Phosphatase: 78 U/L (ref 38–126)
Anion gap: 8 (ref 5–15)
BUN: 13 mg/dL (ref 6–20)
CO2: 21 mmol/L — ABNORMAL LOW (ref 22–32)
Calcium: 8.8 mg/dL — ABNORMAL LOW (ref 8.9–10.3)
Chloride: 106 mmol/L (ref 101–111)
Creatinine, Ser: 0.74 mg/dL (ref 0.44–1.00)
GFR calc Af Amer: >60 mL/min (ref >60)
GFR calc non Af Amer: >60 mL/min (ref >60)
Glucose, Bld: 121 mg/dL — ABNORMAL HIGH (ref 65–99)
Potassium: 4.1 mmol/L (ref 3.5–5.1)
Sodium: 135 mmol/L (ref 135–145)
Total Bilirubin: 0.3 mg/dL (ref 0.3–1.2)
Total Protein: 6.6 g/dL (ref 6.5–8.1)

## 2017-05-01 LAB — I-STAT BETA HCG BLOOD, ED (MC, WL, AP ONLY): I-stat hCG, quantitative: <5 m[IU]/mL (ref <5)

## 2017-05-01 LAB — LIPASE, BLOOD: LIPASE: 42 U/L (ref 11–51)

## 2017-05-01 LAB — CBC
HCT: 38.3 % (ref 36.0–46.0)
HEMOGLOBIN: 12.3 g/dL (ref 12.0–15.0)
MCH: 27.5 pg (ref 26.0–34.0)
MCHC: 32.1 g/dL (ref 30.0–36.0)
MCV: 85.7 fL (ref 78.0–100.0)
Platelets: 307 10*3/uL (ref 150–400)
RBC: 4.47 MIL/uL (ref 3.87–5.11)
RDW: 13.3 % (ref 11.5–15.5)
WBC: 10.4 10*3/uL (ref 4.0–10.5)

## 2017-05-01 NOTE — ED Triage Notes (Signed)
C/o right sided upper abdominal pain wrapping around to flank starting in the last 15 minutes.  Pt states this is her 4th "gallbladder attack" this month.

## 2017-05-02 ENCOUNTER — Emergency Department (HOSPITAL_COMMUNITY)
Admission: EM | Admit: 2017-05-02 | Discharge: 2017-05-02 | Discharge status: Against Medical Advice | Payer: BLUE CROSS/BLUE SHIELD | Attending: Emergency Medicine | Admitting: Emergency Medicine

## 2017-05-02 NOTE — ED Notes (Signed)
No answer for treatment room x2 

## 2017-05-02 NOTE — ED Notes (Signed)
No answer in waiting area.

## 2017-05-15 DIAGNOSIS — D72829 Elevated white blood cell count, unspecified: Secondary | ICD-10-CM | POA: Diagnosis not present

## 2017-05-15 DIAGNOSIS — R1010 Upper abdominal pain, unspecified: Secondary | ICD-10-CM | POA: Diagnosis not present

## 2017-05-15 DIAGNOSIS — R1084 Generalized abdominal pain: Secondary | ICD-10-CM | POA: Diagnosis not present

## 2017-05-19 DIAGNOSIS — R1011 Right upper quadrant pain: Secondary | ICD-10-CM | POA: Diagnosis not present

## 2017-06-01 ENCOUNTER — Encounter: Payer: Self-pay | Admitting: Emergency Medicine

## 2017-06-01 ENCOUNTER — Ambulatory Visit: Payer: Self-pay | Admitting: Surgery

## 2017-06-01 ENCOUNTER — Ambulatory Visit (INDEPENDENT_AMBULATORY_CARE_PROVIDER_SITE_OTHER): Payer: BLUE CROSS/BLUE SHIELD | Admitting: Emergency Medicine

## 2017-06-01 ENCOUNTER — Other Ambulatory Visit: Payer: Self-pay

## 2017-06-01 VITALS — BP 117/64 | HR 86 | Temp 98.6°F | Resp 16 | Ht 62.25 in | Wt 197.4 lb

## 2017-06-01 DIAGNOSIS — R52 Pain, unspecified: Secondary | ICD-10-CM | POA: Diagnosis not present

## 2017-06-01 DIAGNOSIS — K8010 Calculus of gallbladder with chronic cholecystitis without obstruction: Secondary | ICD-10-CM | POA: Diagnosis not present

## 2017-06-01 DIAGNOSIS — R0981 Nasal congestion: Secondary | ICD-10-CM

## 2017-06-01 DIAGNOSIS — R05 Cough: Secondary | ICD-10-CM | POA: Diagnosis not present

## 2017-06-01 DIAGNOSIS — J988 Other specified respiratory disorders: Secondary | ICD-10-CM

## 2017-06-01 DIAGNOSIS — R059 Cough, unspecified: Secondary | ICD-10-CM

## 2017-06-01 DIAGNOSIS — B9789 Other viral agents as the cause of diseases classified elsewhere: Secondary | ICD-10-CM

## 2017-06-01 DIAGNOSIS — R058 Other specified cough: Secondary | ICD-10-CM | POA: Insufficient documentation

## 2017-06-01 LAB — POCT INFLUENZA A/B
INFLUENZA B, POC: NEGATIVE
Influenza A, POC: NEGATIVE

## 2017-06-01 MED ORDER — TRIAMCINOLONE ACETONIDE 55 MCG/ACT NA AERO: 2.0000 | INHALATION_SPRAY | Freq: Every day | NASAL | 12 refills | Status: DC

## 2017-06-01 MED ORDER — PSEUDOEPHEDRINE-GUAIFENESIN ER 60-600 MG PO TB12: 1.0000 | ORAL_TABLET | Freq: Two times a day (BID) | ORAL | 1 refills | Status: AC

## 2017-06-01 MED ORDER — PREDNISONE 20 MG PO TABS: 40.0000 mg | ORAL_TABLET | Freq: Every day | ORAL | 0 refills | Status: AC

## 2017-06-01 NOTE — Progress Notes (Signed)
sHAY

## 2017-06-01 NOTE — Progress Notes (Signed)
[name] [name] 22 y.o.   Chief Complaint  Patient presents with  . Cough    productive with green mucus since yesterday  . Generalized Body Aches    headache    HISTORY OF PRESENT ILLNESS: This is a 22 y.o. female complaining to 3-day history of flulike symptoms.  People at work and at home sick with the same.  URI   This is a new problem. The current episode started in the past 7 days. The problem has been gradually worsening. There has been no fever. Associated symptoms include congestion, coughing, rhinorrhea, sinus pain and sneezing. Pertinent negatives include no abdominal pain, chest pain, diarrhea, dysuria, ear pain, headaches, joint pain, joint swelling, nausea, neck pain, rash, sore throat, vomiting or wheezing. She has tried nothing for the symptoms.     Prior to Admission medications   Medication Sig Start Date End Date Taking? Authorizing Provider  DM-Phenylephrine-Acetaminophen (ALKA-SELTZER PLS SINUS & COUGH PO) Take by mouth as needed.   Yes [provider]  GuaiFENesin (MUCINEX PO) Take by mouth as needed.   Yes [provider]  ferrous sulfate 325 (65 FE) MG tablet Take 1 tablet (325 mg total) daily by mouth. Patient not taking: Reported on 06/01/2017 02/14/17 02/14/18  Karena Addison, CNM  ibuprofen (ADVIL,MOTRIN) 600 MG tablet Take 1 tablet (600 mg total) every 6 (six) hours by mouth. Patient not taking: Reported on 06/01/2017 02/14/17   Karena Addison, CNM  oxyCODONE-acetaminophen (PERCOCET/ROXICET) 5-325 MG tablet Take 1 tablet every 4 (four) hours as needed by mouth (pain scale 4-7). Patient not taking: Reported on 06/01/2017 02/14/17   Karena Addison, CNM  Prenatal Vit-Fe Fumarate-FA (PRENATAL MULTIVITAMIN) TABS tablet Take 1 tablet by mouth daily at 12 noon.    [provider]    No Known Allergies  Patient Active Problem List   Diagnosis Date Noted  . Acute blood loss anemia 02/14/2017  . Rubella non-immune status,  antepartum 02/14/2017  . Cesarean delivery delivered / Indication: vasa previa 11/9 02/12/2017  . Postpartum care following cesarean delivery (11/9) 02/12/2017  . Vasa previa 01/13/2017    Past Medical History:  Diagnosis Date  . Headache   . Medical history non-contributory     Past Surgical History:  Procedure Laterality Date  . CESAREAN SECTION N/A 02/11/2017   Procedure: Primary CESAREAN SECTION;  Surgeon: Olivia Mackie, MD;  Location: [facility]IRTHING SU[facility];  Service: Obstetrics;  Laterality: N/A;  EDD: 03/20/17  . NO PAST SURGERIES      Social History   Socioeconomic History  . Marital status: Married    Spouse name: Not on file  . Number of children: Not on file  . Years of education: Not on file  . Highest education level: Not on file  Social Needs  . Financial resource strain: Not on file  . Food insecurity - worry: Not on file  . Food insecurity - inability: Not on file  . Transportation needs - medical: Not on file  . Transportation needs - non-medical: Not on file  Occupational History  . Not on file  Tobacco Use  . Smoking status: Never Smoker  . Smokeless tobacco: Never Used  Substance and Sexual Activity  . Alcohol use: No  . Drug use: No  . Sexual activity: Yes    Birth control/protection: None  Other Topics Concern  . Not on file  Social History Narrative  . Not on file    Family History  Problem Relation Age of  Onset  . Heart disease Mother   . Hypertension Mother 90       PULMONARY ARTERIAL HYPERTENSION  . Mental retardation Maternal Uncle      Review of Systems  Constitutional: Negative for chills, fever and malaise/fatigue.  HENT: Positive for congestion, rhinorrhea, sinus pain and sneezing. Negative for ear pain and sore throat.   Eyes: Negative.  Negative for discharge and redness.  Respiratory: Positive for cough. Negative for wheezing.   Cardiovascular: Negative for chest pain and palpitations.  Gastrointestinal: Negative for  abdominal pain, blood in stool, diarrhea, melena, nausea and vomiting.  Genitourinary: Negative for dysuria.  Musculoskeletal: Negative for joint pain and neck pain.  Skin: Negative for rash.  Neurological: Negative.  Negative for dizziness and headaches.  Endo/Heme/Allergies: Negative.   All other systems reviewed and are negative.   Vitals:   06/01/17 1449  BP: 117/64  Pulse: 86  Resp: 16  Temp: 98.6 F (37 C)  SpO2: 97%    Physical Exam  Constitutional: She is oriented to person, place, and time. She appears well-developed and well-nourished.  HENT:  Head: Normocephalic and atraumatic.  Right Ear: External ear normal.  Left Ear: External ear normal.  Nose: Nose normal.  Mouth/Throat: Oropharynx is clear and moist.  Eyes: Conjunctivae are normal. Pupils are equal, round, and reactive to light.  Neck: Normal range of motion. Neck supple. No JVD present. No thyromegaly present.  Cardiovascular: Normal rate, regular rhythm and normal heart sounds.  Pulmonary/Chest: Effort normal and breath sounds normal.  Abdominal: Soft. Bowel sounds are normal. She exhibits no distension. There is no tenderness.  Musculoskeletal: Normal range of motion.  Lymphadenopathy:    She has no cervical adenopathy.  Neurological: She is alert and oriented to person, place, and time. No sensory deficit. She exhibits normal muscle tone.  Skin: Skin is warm and dry. Capillary refill takes less than 2 seconds. No rash noted.  Psychiatric: She has a normal mood and affect. Her behavior is normal.  Vitals reviewed.  Results for orders placed or performed in visit on 06/01/17 (from the past 24 hour(s))  POCT Influenza A/B     Status: None   Collection Time: 06/01/17  3:22 PM  Result Value Ref Range   Influenza A, POC Negative Negative   Influenza B, POC Negative Negative     ASSESSMENT & PLAN: [name] was seen today for cough and generalized body aches.  Diagnoses and all orders for this  visit:  Cough -     POCT Influenza A/B -     predniSONE (DELTASONE) 20 MG tablet; Take 2 tablets (40 mg total) by mouth daily with breakfast for 5 days.  Generalized body aches -     POCT Influenza A/B  Sinus congestion -     triamcinolone (NASACORT) 55 MCG/ACT AERO nasal inhaler; Place 2 sprays into the nose daily. -     predniSONE (DELTASONE) 20 MG tablet; Take 2 tablets (40 mg total) by mouth daily with breakfast for 5 days. -     pseudoephedrine-guaifenesin (MUCINEX D) 60-600 MG 12 hr tablet; Take 1 tablet by mouth every 12 (twelve) hours for 5 days.  Viral respiratory illness -     predniSONE (DELTASONE) 20 MG tablet; Take 2 tablets (40 mg total) by mouth daily with breakfast for 5 days.    Patient Instructions       IF you received an x-ray today, you will receive an invoice from [facility]. Please contact [facility] at  [phone] with questions or concerns regarding your invoice.   IF you received labwork today, you will receive an invoice from [facility]. Please contact [facility] at [phone] with questions or concerns regarding your invoice.   Our billing staff will not be able to assist you with questions regarding bills from these companies.  You will be contacted with the lab results as soon as they are available. The fastest way to get your results is to activate your My Chart account. Instructions are located on the last page of this paperwork. If you have not heard from [place] regarding the results in 2 weeks, please contact this office.     Viral Respiratory Infection A viral respiratory infection is an illness that affects parts of the body used for breathing, like the lungs, nose, and throat. It is caused by a germ called a virus. Some examples of this kind of infection are:  A cold.  The flu (influenza).  A respiratory syncytial virus (RSV) infection.  How do I know if I have this infection? Most of the time this infection  causes:  A stuffy or runny nose.  Yellow or green fluid in the nose.  A cough.  Sneezing.  Tiredness (fatigue).  Achy muscles.  A sore throat.  Sweating or chills.  A fever.  A headache.  How is this infection treated? If the flu is diagnosed early, it may be treated with an antiviral medicine. This medicine shortens the length of time a person has symptoms. Symptoms may be treated with over-the-counter and prescription medicines, such as:  Expectorants. These make it easier to cough up mucus.  Decongestant nasal sprays.  Doctors do not prescribe antibiotic medicines for viral infections. They do not work with this kind of infection. How do I know if I should stay home? To keep others from getting sick, stay home if you have:  A fever.  A lasting cough.  A sore throat.  A runny nose.  Sneezing.  Muscles aches.  Headaches.  Tiredness.  Weakness.  Chills.  Sweating.  An upset stomach (nausea).  Follow these instructions at home:  Rest as much as possible.  Take over-the-counter and prescription medicines only as told by your doctor.  Drink enough fluid to keep your pee (urine) clear or pale yellow.  Gargle with salt water. Do this 3-4 times per day or as needed. To make a salt-water mixture, dissolve -1 tsp of salt in 1 cup of warm water. Make sure the salt dissolves all the way.  Use nose drops made from salt water. This helps with stuffiness (congestion). It also helps soften the skin around your nose.  Do not drink alcohol.  Do not use tobacco products, including cigarettes, chewing tobacco, and e-cigarettes. If you need help quitting, ask your doctor. Get help if:  Your symptoms last for 10 days or longer.  Your symptoms get worse over time.  You have a fever.  You have very bad pain in your face or forehead.  Parts of your jaw or neck become very swollen. Get help right away if:  You feel pain or pressure in your chest.  You  have shortness of breath.  You faint or feel like you will faint.  You keep throwing up (vomiting).  You feel confused. This information is not intended to replace advice given to you by your health care provider. Make sure you discuss any questions you have with your health care provider. Document Released: 03/04/2008 Document Revised: 08/28/2015 Document Reviewed:  08/28/2014 Elsevier Interactive Patient Education  2018 Elsevier Inc.      Edwina BarthMiguel Sherrian Nunnelley, MD [facility]

## 2017-06-01 NOTE — Patient Instructions (Addendum)
   IF you received an x-ray today, you will receive an invoice from [facility]. Please contact [facility] at [phone] with questions or concerns regarding your invoice.   IF you received labwork today, you will receive an invoice from [facility]. Please contact [facility] at [phone] with questions or concerns regarding your invoice.   Our billing staff will not be able to assist you with questions regarding bills from these companies.  You will be contacted with the lab results as soon as they are available. The fastest way to get your results is to activate your My Chart account. Instructions are located on the last page of this paperwork. If you have not heard from us regarding the results in 2 weeks, please contact this office.     Viral Respiratory Infection A viral respiratory infection is an illness that affects parts of the body used for breathing, like the lungs, nose, and throat. It is caused by a germ called a virus. Some examples of this kind of infection are:  A cold.  The flu (influenza).  A respiratory syncytial virus (RSV) infection.  How do I know if I have this infection? Most of the time this infection causes:  A stuffy or runny nose.  Yellow or green fluid in the nose.  A cough.  Sneezing.  Tiredness (fatigue).  Achy muscles.  A sore throat.  Sweating or chills.  A fever.  A headache.  How is this infection treated? If the flu is diagnosed early, it may be treated with an antiviral medicine. This medicine shortens the length of time a person has symptoms. Symptoms may be treated with over-the-counter and prescription medicines, such as:  Expectorants. These make it easier to cough up mucus.  Decongestant nasal sprays.  Doctors do not prescribe antibiotic medicines for viral infections. They do not work with this kind of infection. How do I know if I should stay home? To keep others from getting sick, stay home if you  have:  A fever.  A lasting cough.  A sore throat.  A runny nose.  Sneezing.  Muscles aches.  Headaches.  Tiredness.  Weakness.  Chills.  Sweating.  An upset stomach (nausea).  Follow these instructions at home:  Rest as much as possible.  Take over-the-counter and prescription medicines only as told by your doctor.  Drink enough fluid to keep your pee (urine) clear or pale yellow.  Gargle with salt water. Do this 3-4 times per day or as needed. To make a salt-water mixture, dissolve -1 tsp of salt in 1 cup of warm water. Make sure the salt dissolves all the way.  Use nose drops made from salt water. This helps with stuffiness (congestion). It also helps soften the skin around your nose.  Do not drink alcohol.  Do not use tobacco products, including cigarettes, chewing tobacco, and e-cigarettes. If you need help quitting, ask your doctor. Get help if:  Your symptoms last for 10 days or longer.  Your symptoms get worse over time.  You have a fever.  You have very bad pain in your face or forehead.  Parts of your jaw or neck become very swollen. Get help right away if:  You feel pain or pressure in your chest.  You have shortness of breath.  You faint or feel like you will faint.  You keep throwing up (vomiting).  You feel confused. This information is not intended to replace advice given to you by your health care provider.   Make sure you discuss any questions you have with your health care provider. Document Released: 03/04/2008 Document Revised: 08/28/2015 Document Reviewed: 08/28/2014 Elsevier Interactive Patient Education  2018 [employer]vier [employer].  

## 2017-06-16 ENCOUNTER — Encounter (HOSPITAL_COMMUNITY): Payer: Self-pay

## 2017-06-16 NOTE — Pre-Procedure Instructions (Signed)
The following are in epic: EKG 01/29/2017 Last office visit note Dr. Alvy BimlerSagardia 06/01/2017

## 2017-06-17 ENCOUNTER — Inpatient Hospital Stay (HOSPITAL_COMMUNITY)
Admission: RE | Admit: 2017-06-17 | Discharge: 2017-06-17 | Disposition: A | Discharge status: Home / Self Care | Payer: Medicaid Other | Source: Ambulatory Visit

## 2017-06-20 ENCOUNTER — Encounter (HOSPITAL_COMMUNITY): Payer: Self-pay

## 2017-06-20 ENCOUNTER — Other Ambulatory Visit: Payer: Self-pay

## 2017-06-20 ENCOUNTER — Encounter (HOSPITAL_COMMUNITY)
Admission: RE | Admit: 2017-06-20 | Discharge: 2017-06-20 | Disposition: A | Discharge status: Home / Self Care | Payer: BLUE CROSS/BLUE SHIELD | Source: Ambulatory Visit | Attending: Surgery | Admitting: Surgery

## 2017-06-20 DIAGNOSIS — K8080 Other cholelithiasis without obstruction: Secondary | ICD-10-CM | POA: Diagnosis not present

## 2017-06-20 DIAGNOSIS — K8010 Calculus of gallbladder with chronic cholecystitis without obstruction: Secondary | ICD-10-CM | POA: Diagnosis not present

## 2017-06-20 DIAGNOSIS — F17200 Nicotine dependence, unspecified, uncomplicated: Secondary | ICD-10-CM | POA: Diagnosis not present

## 2017-06-20 DIAGNOSIS — Z6835 Body mass index (BMI) 35.0-35.9, adult: Secondary | ICD-10-CM | POA: Diagnosis not present

## 2017-06-20 HISTORY — DX: Calculus of gallbladder without cholecystitis without obstruction: K80.20

## 2017-06-20 LAB — CBC WITH DIFFERENTIAL/PLATELET
BASOS ABS: 0 10*3/uL (ref 0.0–0.1)
BASOS PCT: 0 %
EOS ABS: 0.1 10*3/uL (ref 0.0–0.7)
Eosinophils Relative: 1 %
HEMATOCRIT: 39 % (ref 36.0–46.0)
HEMOGLOBIN: 12.6 g/dL (ref 12.0–15.0)
Lymphocytes Relative: 35 %
Lymphs Abs: 3 10*3/uL (ref 0.7–4.0)
MCH: 27.6 pg (ref 26.0–34.0)
MCHC: 32.3 g/dL (ref 30.0–36.0)
MCV: 85.5 fL (ref 78.0–100.0)
MONOS PCT: 6 %
Monocytes Absolute: 0.5 10*3/uL (ref 0.1–1.0)
NEUTROS ABS: 4.9 10*3/uL (ref 1.7–7.7)
NEUTROS PCT: 58 %
Platelets: 421 10*3/uL — ABNORMAL HIGH (ref 150–400)
RBC: 4.56 MIL/uL (ref 3.87–5.11)
RDW: 13.4 % (ref 11.5–15.5)
WBC: 8.6 10*3/uL (ref 4.0–10.5)

## 2017-06-20 LAB — COMPREHENSIVE METABOLIC PANEL
ALBUMIN: 4 g/dL (ref 3.5–5.0)
ALK PHOS: 71 U/L (ref 38–126)
ALT: 24 U/L (ref 14–54)
ANION GAP: 8 (ref 5–15)
AST: 25 U/L (ref 15–41)
BILIRUBIN TOTAL: 0.6 mg/dL (ref 0.3–1.2)
BUN: 11 mg/dL (ref 6–20)
CO2: 28 mmol/L (ref 22–32)
Calcium: 9.3 mg/dL (ref 8.9–10.3)
Chloride: 109 mmol/L (ref 101–111)
Creatinine, Ser: 0.66 mg/dL (ref 0.44–1.00)
GFR calc Af Amer: >60 mL/min (ref >60)
GFR calc non Af Amer: >60 mL/min (ref >60)
GLUCOSE: 101 mg/dL — AB (ref 65–99)
POTASSIUM: 4.6 mmol/L (ref 3.5–5.1)
SODIUM: 145 mmol/L (ref 135–145)
Total Protein: 7.4 g/dL (ref 6.5–8.1)

## 2017-06-20 LAB — PROTIME-INR
INR: 1.02
Prothrombin Time: 13.3 seconds (ref 11.4–15.2)

## 2017-06-20 NOTE — Anesthesia Preprocedure Evaluation (Addendum)
Anesthesia Evaluation  Patient identified by MRN, date of birth, ID band Patient awake    Reviewed: Allergy & Precautions, NPO status , Patient's Chart, lab work & pertinent test results  History of Anesthesia Complications Negative for: history of anesthetic complications  Airway Mallampati: I  TM Distance: >3 FB Neck ROM: Full    Dental  (+) Dental Advisory Given   Pulmonary former smoker,    breath sounds clear to auscultation       Cardiovascular  Rhythm:Regular Rate:Normal     Neuro/Psych negative neurological ROS     GI/Hepatic negative GI ROS, Cholelithiasis   Endo/Other  Morbid obesity  Renal/GU negative Renal ROS     Musculoskeletal  (+) Arthritis ,   Abdominal (+) + obese,   Peds  Hematology negative hematology ROS (+)   Anesthesia Other Findings   Reproductive/Obstetrics                            Anesthesia Physical Anesthesia Plan  ASA: II  Anesthesia Plan: General   Post-op Pain Management:    Induction: Intravenous  PONV Risk Score and Plan: 4 or greater and Scopolamine patch - Pre-op, Dexamethasone and Ondansetron  Airway Management Planned: Oral ETT  Additional Equipment:   Intra-op Plan:   Post-operative Plan: Extubation in OR  Informed Consent: I have reviewed the patients History and Physical, chart, labs and discussed the procedure including the risks, benefits and alternatives for the proposed anesthesia with the patient or authorized representative who has indicated his/her understanding and acceptance.   Dental advisory given  Plan Discussed with: CRNA and Surgeon  Anesthesia Plan Comments: (Plan routine monitors, GETA)        Anesthesia Quick Evaluation

## 2017-06-20 NOTE — Patient Instructions (Signed)
Your procedure is scheduled on: Tomorrow, June 21, 2017   Surgery Time:  7:30AM-9:00AM   [facility] Main  Entrance   Arrive by 5:30 AM pick up the phone at the front desk dial [phone], have a seat in the lobby and a staff member will come and escort you to Short Stay Department   Call this number if you have problems the morning of surgery [phone]   Do not eat food or drink liquids :After Midnight.   Do NOT smoke after Midnight   Take these medicines the morning of surgery with A SIP OF WATER: None                               You may not have any metal on your body including hair pins, jewelry, and body piercings             Do not wear make-up, lotions, powders, perfumes/cologne, or deodorant             Do not wear nail polish.  Do not shave  48 hours prior to surgery.                Do not bring valuables to the hospital. [facility] IS NOT             RESPONSIBLE   FOR VALUABLES.   Contacts, dentures or bridgework may not be worn into surgery.   Leave suitcase in the car. After surgery it may be brought to your room.   Special Instructions: Bring a copy of your healthcare power of attorney and living will documents         the day of surgery if you haven't scanned them in before.              Please read over the following fact sheets you were given:  [facility] - Preparing [facility] Before surgery, you can play an important role.  Because skin is not sterile, your skin needs to be as free of germs as possible.  You can reduce the number of germs on your skin by washing with CHG (chlorahexidine gluconate) soap before surgery.  CHG is an antiseptic cleaner which kills germs and bonds with the skin to continue killing germs even after washing. Please DO NOT use if you have an allergy to CHG or antibacterial soaps.  If your skin becomes reddened/irritated stop using the CHG and inform your nurse when you arrive at Short Stay. Do not shave  (including legs and underarms) for at least 48 hours prior to the first CHG shower.  You may shave your face/neck.  Please follow these instructions carefully:  1.  Shower with CHG Soap the night before surgery and the  morning of surgery.  2.  If you choose to wash your hair, wash your hair first as usual with your normal  shampoo.  3.  After you shampoo, rinse your hair and body thoroughly to remove the shampoo.                             4.  Use CHG as you would any other liquid soap.  You can apply chg directly to the skin and wash.  Gently with a scrungie or clean washcloth.  5.  Apply the CHG Soap to your body ONLY FROM THE NECK DOWN.  Do   not use on face/ open                           Wound or open sores. Avoid contact with eyes, ears mouth and   genitals (private parts).                       Wash face,  Genitals (private parts) with your normal soap.             6.  Wash thoroughly, paying special attention to the area where your    surgery  will be performed.  7.  Thoroughly rinse your body with warm water from the neck down.  8.  DO NOT shower/wash with your normal soap after using and rinsing off the CHG Soap.                9.  Pat yourself dry with a clean towel.            10.  Wear clean pajamas.            11.  Place clean sheets on your bed the night of your first shower and do not  sleep with pets. Day of Surgery : Do not apply any lotions/deodorants the morning of surgery.  Please wear clean clothes to the hospital/surgery center.  FAILURE TO FOLLOW THESE INSTRUCTIONS MAY RESULT IN THE CANCELLATION OF YOUR SURGERY  PATIENT SIGNATURE_________________________________  NURSE SIGNATURE__________________________________  ________________________________________________________________________

## 2017-06-20 NOTE — Pre-Procedure Instructions (Signed)
CBC with diff and CMP results 06/20/17 faxed to Dr. Gerrit FriendsGerkin via epic

## 2017-06-21 ENCOUNTER — Encounter (HOSPITAL_COMMUNITY): Payer: Self-pay | Admitting: Emergency Medicine

## 2017-06-21 ENCOUNTER — Ambulatory Visit (HOSPITAL_COMMUNITY): Payer: BLUE CROSS/BLUE SHIELD

## 2017-06-21 ENCOUNTER — Encounter (HOSPITAL_COMMUNITY)
Admission: RE | Disposition: A | Discharge status: Home / Self Care | Payer: Self-pay | Source: Ambulatory Visit | Attending: Surgery

## 2017-06-21 ENCOUNTER — Ambulatory Visit (HOSPITAL_COMMUNITY): Payer: BLUE CROSS/BLUE SHIELD | Admitting: Anesthesiology

## 2017-06-21 ENCOUNTER — Other Ambulatory Visit: Payer: Self-pay

## 2017-06-21 ENCOUNTER — Ambulatory Visit (HOSPITAL_COMMUNITY)
Admission: RE | Admit: 2017-06-21 | Discharge: 2017-06-21 | Disposition: A | Discharge status: Home / Self Care | Payer: BLUE CROSS/BLUE SHIELD | Source: Ambulatory Visit | Attending: Surgery | Admitting: Surgery

## 2017-06-21 DIAGNOSIS — Z6835 Body mass index (BMI) 35.0-35.9, adult: Secondary | ICD-10-CM | POA: Insufficient documentation

## 2017-06-21 DIAGNOSIS — F17200 Nicotine dependence, unspecified, uncomplicated: Secondary | ICD-10-CM | POA: Diagnosis not present

## 2017-06-21 DIAGNOSIS — K8010 Calculus of gallbladder with chronic cholecystitis without obstruction: Secondary | ICD-10-CM

## 2017-06-21 DIAGNOSIS — K8080 Other cholelithiasis without obstruction: Secondary | ICD-10-CM | POA: Diagnosis not present

## 2017-06-21 DIAGNOSIS — K219 Gastro-esophageal reflux disease without esophagitis: Secondary | ICD-10-CM | POA: Diagnosis not present

## 2017-06-21 DIAGNOSIS — R52 Pain, unspecified: Secondary | ICD-10-CM

## 2017-06-21 HISTORY — DX: Calculus of gallbladder with chronic cholecystitis without obstruction: K80.10

## 2017-06-21 HISTORY — PX: CHOLECYSTECTOMY: SHX55

## 2017-06-21 LAB — PREGNANCY, URINE: Preg Test, Ur: NEGATIVE

## 2017-06-21 SURGERY — LAPAROSCOPIC CHOLECYSTECTOMY WITH INTRAOPERATIVE CHOLANGIOGRAM
Anesthesia: General | Site: Abdomen

## 2017-06-21 MED ORDER — LIDOCAINE 2% (20 MG/ML) 5 ML SYRINGE
INTRAMUSCULAR | Status: AC
Start: 2017-06-21 — End: ?
  Filled 2017-06-21: qty 5

## 2017-06-21 MED ORDER — ONDANSETRON HCL 4 MG/2ML IJ SOLN
INTRAMUSCULAR | Status: DC | PRN
  Administered 2017-06-21: 4 mg via INTRAVENOUS

## 2017-06-21 MED ORDER — CHLORHEXIDINE GLUCONATE CLOTH 2 % EX PADS: 6.0000 | MEDICATED_PAD | Freq: Once | CUTANEOUS | Status: DC

## 2017-06-21 MED ORDER — FENTANYL CITRATE (PF) 250 MCG/5ML IJ SOLN
INTRAMUSCULAR | Status: DC | PRN
  Administered 2017-06-21: 150 ug via INTRAVENOUS
  Administered 2017-06-21 (×2): 50 ug via INTRAVENOUS

## 2017-06-21 MED ORDER — CEFAZOLIN SODIUM-DEXTROSE 2-4 GM/100ML-% IV SOLN
2.0000 g | INTRAVENOUS | Status: AC
  Administered 2017-06-21: 2 g via INTRAVENOUS
  Filled 2017-06-21: qty 100

## 2017-06-21 MED ORDER — MIDAZOLAM HCL 2 MG/2ML IJ SOLN
INTRAMUSCULAR | Status: AC
  Filled 2017-06-21: qty 2

## 2017-06-21 MED ORDER — LIDOCAINE 2% (20 MG/ML) 5 ML SYRINGE
INTRAMUSCULAR | Status: DC | PRN
  Administered 2017-06-21: 30 mg via INTRAVENOUS

## 2017-06-21 MED ORDER — IOPAMIDOL (ISOVUE-300) INJECTION 61%
INTRAVENOUS | Status: AC
  Filled 2017-06-21: qty 50

## 2017-06-21 MED ORDER — ONDANSETRON HCL 4 MG/2ML IJ SOLN
INTRAMUSCULAR | Status: AC
  Filled 2017-06-21: qty 2

## 2017-06-21 MED ORDER — BUPIVACAINE-EPINEPHRINE 0.5% -1:200000 IJ SOLN
INTRAMUSCULAR | Status: DC | PRN
  Administered 2017-06-21: 20 mL

## 2017-06-21 MED ORDER — DEXAMETHASONE SODIUM PHOSPHATE 10 MG/ML IJ SOLN
INTRAMUSCULAR | Status: DC | PRN
  Administered 2017-06-21: 10 mg via INTRAVENOUS

## 2017-06-21 MED ORDER — MIDAZOLAM HCL 2 MG/2ML IJ SOLN
0.5000 mg | Freq: Once | INTRAMUSCULAR | Status: AC | PRN
  Administered 2017-06-21: 2 mg via INTRAVENOUS

## 2017-06-21 MED ORDER — HYDROMORPHONE HCL 1 MG/ML IJ SOLN
INTRAMUSCULAR | Status: AC
  Filled 2017-06-21: qty 1

## 2017-06-21 MED ORDER — DEXAMETHASONE SODIUM PHOSPHATE 10 MG/ML IJ SOLN
INTRAMUSCULAR | Status: AC
  Filled 2017-06-21: qty 1

## 2017-06-21 MED ORDER — SUGAMMADEX SODIUM 200 MG/2ML IV SOLN
INTRAVENOUS | Status: DC | PRN
  Administered 2017-06-21: 200 mg via INTRAVENOUS

## 2017-06-21 MED ORDER — ROCURONIUM BROMIDE 10 MG/ML (PF) SYRINGE
PREFILLED_SYRINGE | INTRAVENOUS | Status: DC | PRN
Start: 2017-06-21 — End: 2017-06-21
  Administered 2017-06-21: 50 mg via INTRAVENOUS

## 2017-06-21 MED ORDER — 0.9 % SODIUM CHLORIDE (POUR BTL) OPTIME
TOPICAL | Status: DC | PRN
  Administered 2017-06-21: 1000 mL

## 2017-06-21 MED ORDER — ROCURONIUM BROMIDE 10 MG/ML (PF) SYRINGE
PREFILLED_SYRINGE | INTRAVENOUS | Status: AC
Start: 2017-06-21 — End: ?
  Filled 2017-06-21: qty 5

## 2017-06-21 MED ORDER — SODIUM CHLORIDE 0.9 % IV SOLN
INTRAVENOUS | Status: DC | PRN
  Administered 2017-06-21: 15 mL

## 2017-06-21 MED ORDER — SCOPOLAMINE 1 MG/3DAYS TD PT72
MEDICATED_PATCH | TRANSDERMAL | Status: AC
  Filled 2017-06-21: qty 1

## 2017-06-21 MED ORDER — PROPOFOL 10 MG/ML IV BOLUS
INTRAVENOUS | Status: DC | PRN
  Administered 2017-06-21: 200 mg via INTRAVENOUS

## 2017-06-21 MED ORDER — BUPIVACAINE-EPINEPHRINE (PF) 0.5% -1:200000 IJ SOLN
INTRAMUSCULAR | Status: AC
Start: 2017-06-21 — End: ?
  Filled 2017-06-21: qty 30

## 2017-06-21 MED ORDER — HYDROMORPHONE HCL 1 MG/ML IJ SOLN
0.2500 mg | INTRAMUSCULAR | Status: DC | PRN
  Administered 2017-06-21: 0.5 mg via INTRAVENOUS

## 2017-06-21 MED ORDER — PROMETHAZINE HCL 25 MG/ML IJ SOLN: 6.2500 mg | INTRAMUSCULAR | Status: DC | PRN

## 2017-06-21 MED ORDER — MEPERIDINE HCL 50 MG/ML IJ SOLN: 6.2500 mg | INTRAMUSCULAR | Status: DC | PRN

## 2017-06-21 MED ORDER — LACTATED RINGERS IR SOLN
Status: DC | PRN
  Administered 2017-06-21: 1000 mL

## 2017-06-21 MED ORDER — PROPOFOL 10 MG/ML IV BOLUS
INTRAVENOUS | Status: AC
Start: 2017-06-21 — End: ?
  Filled 2017-06-21: qty 40

## 2017-06-21 MED ORDER — FENTANYL CITRATE (PF) 250 MCG/5ML IJ SOLN
INTRAMUSCULAR | Status: AC
  Filled 2017-06-21: qty 5

## 2017-06-21 MED ORDER — LACTATED RINGERS IV SOLN
INTRAVENOUS | Status: DC
  Administered 2017-06-21 (×3): via INTRAVENOUS

## 2017-06-21 SURGICAL SUPPLY — 31 items
APPLIER CLIP ROT 10 11.4 M/L (STAPLE) ×2
CABLE HIGH FREQUENCY MONO STRZ (ELECTRODE) ×2 IMPLANT
CHLORAPREP W/TINT 26ML (MISCELLANEOUS) ×2 IMPLANT
CLIP APPLIE ROT 10 11.4 M/L (STAPLE) ×1 IMPLANT
COVER MAYO STAND STRL (DRAPES) ×2 IMPLANT
COVER SURGICAL LIGHT HANDLE (MISCELLANEOUS) ×2 IMPLANT
DECANTER SPIKE VIAL GLASS SM (MISCELLANEOUS) ×2 IMPLANT
DRAPE C-ARM 42X120 X-RAY (DRAPES) ×2 IMPLANT
DRAPE UTILITY XL STRL (DRAPES) ×2 IMPLANT
ELECT REM PT RETURN 15FT ADLT (MISCELLANEOUS) ×2 IMPLANT
GAUZE SPONGE 2X2 8PLY STRL LF (GAUZE/BANDAGES/DRESSINGS) ×1 IMPLANT
GLOVE SURG ORTHO 8.0 STRL STRW (GLOVE) ×2 IMPLANT
GOWN STRL REUS W/TWL XL LVL3 (GOWN DISPOSABLE) ×4 IMPLANT
HEMOSTAT SURGICEL 4X8 (HEMOSTASIS) IMPLANT
KIT BASIN OR (CUSTOM PROCEDURE TRAY) ×2 IMPLANT
POUCH SPECIMEN RETRIEVAL 10MM (ENDOMECHANICALS) ×2 IMPLANT
SCISSORS LAP 5X35 DISP (ENDOMECHANICALS) ×2 IMPLANT
SET CHOLANGIOGRAPH MIX (MISCELLANEOUS) ×2 IMPLANT
SET IRRIG TUBING LAPAROSCOPIC (IRRIGATION / IRRIGATOR) ×2 IMPLANT
SLEEVE XCEL OPT CAN 5 100 (ENDOMECHANICALS) ×2 IMPLANT
SPONGE GAUZE 2X2 STER 10/PKG (GAUZE/BANDAGES/DRESSINGS) ×1
STRIP CLOSURE SKIN 1/2X4 (GAUZE/BANDAGES/DRESSINGS) ×2 IMPLANT
SUT MNCRL AB 4-0 PS2 18 (SUTURE) ×2 IMPLANT
TAPE CLOTH SURG 4X10 WHT LF (GAUZE/BANDAGES/DRESSINGS) ×2 IMPLANT
TOWEL OR 17X26 10 PK STRL BLUE (TOWEL DISPOSABLE) ×2 IMPLANT
TOWEL OR NON WOVEN STRL DISP B (DISPOSABLE) ×2 IMPLANT
TRAY LAPAROSCOPIC (CUSTOM PROCEDURE TRAY) ×2 IMPLANT
TROCAR BLADELESS OPT 5 100 (ENDOMECHANICALS) ×2 IMPLANT
TROCAR XCEL BLUNT TIP 100MML (ENDOMECHANICALS) ×2 IMPLANT
TROCAR XCEL NON-BLD 11X100MML (ENDOMECHANICALS) ×2 IMPLANT
TUBING INSUF HEATED (TUBING) ×2 IMPLANT

## 2017-06-21 NOTE — Anesthesia Procedure Notes (Signed)
Procedure Name: Intubation Date/Time: 06/21/2017 7:37 AM Performed by: Donna Bernardrimble, Everlynn Sagun H, CRNA Pre-anesthesia Checklist: Patient identified, Emergency Drugs available, Suction available, Patient being monitored and Timeout performed Patient Re-evaluated:Patient Re-evaluated prior to induction Oxygen Delivery Method: Circle system utilized Preoxygenation: Pre-oxygenation with 100% oxygen Induction Type: IV induction Ventilation: Mask ventilation without difficulty Laryngoscope Size: Miller and 2 Grade View: Grade I Tube type: Oral Tube size: 7.0 mm Number of attempts: 1 Airway Equipment and Method: Stylet Placement Confirmation: positive ETCO2,  ETT inserted through vocal cords under direct vision,  CO2 detector and breath sounds checked- equal and bilateral Secured at: 20 cm Tube secured with: Tape Dental Injury: Teeth and Oropharynx as per pre-operative assessment

## 2017-06-21 NOTE — H&P (Signed)
General Surgery [facility]   [name] [name] R Spitler DOB: 11/07/1995 Married / Language: English / Race: White Female   History of Present Illness  The patient is a 22 year old female who presents for evaluation of gall stones.  CC: symptomatic cholelithiasis, chronic cholecystitis  Patient is self-referred. [facility] from which she was also referred for consideration for cholecystectomy. Patient had developed symptoms of biliary colic during her pregnancy. Her child was born February 11, 2017. Patient has continued to have episodes of biliary colic on a weekly basis. She did undergo an abdominal ultrasound which documented multiple gallstones. There is no family history of gallbladder disease. The patient's only prior abdominal surgery was cesarean section. This was her first child. Patient notes chills nausea and epigastric abdominal pain associated with her episodes of biliary colic. She denies jaundice or acholic stools. She denies fever. The patient now presents for consideration of cholecystectomy for treatment of symptomatic cholelithiasis and probable chronic cholecystitis.   Past Surgical History Cesarean Section - 1   Diagnostic Studies History Mammogram  never Pap Smear  1-5 years ago  Allergies No Known Drug Allergies [06/01/2017]: Allergies Reconciled   Medication History No Current Medications Medications Reconciled  Social History  Alcohol use  Occasional alcohol use. Caffeine use  Carbonated beverages, Coffee. No drug use  Tobacco use  Current some day smoker.  Family History Heart Disease  Mother. Respiratory Condition  Mother.  Pregnancy / Birth History Age at menarche  10 years. Gravida  1 Maternal age  24-25 Regular periods   Other Problems Back Pain  Cholelithiasis   Review of Systems General Present- Appetite Loss and Fatigue. Not Present- Chills, Fever, Night Sweats, Weight Gain  and Weight Loss. Respiratory Present- Snoring. Not Present- Bloody sputum, Chronic Cough, Difficulty Breathing and Wheezing. Cardiovascular Present- Difficulty Breathing Lying Down. Not Present- Chest Pain, Leg Cramps, Palpitations, Rapid Heart Rate, Shortness of Breath and Swelling of Extremities. Gastrointestinal Present- Bloating, Constipation, Excessive gas, Indigestion and Nausea. Not Present- Abdominal Pain, Bloody Stool, Change in Bowel Habits, Chronic diarrhea, Difficulty Swallowing, Gets full quickly at meals, Hemorrhoids, Rectal Pain and Vomiting. Musculoskeletal Present- Back Pain, Joint Stiffness and Muscle Pain. Not Present- Joint Pain, Muscle Weakness and Swelling of Extremities.  Vitals  Weight: 199 lb Height: 64in Body Surface Area: 1.95 m Body Mass Index: 34.16 kg/m  Temp.: 98.35F  Pulse: 105 (Regular)  BP: 122/86 (Sitting, Left Arm, Standard)  Physical Exam  See vital signs recorded above  GENERAL APPEARANCE Development: normal Nutritional status: normal Gross deformities: none  SKIN Rash, lesions, ulcers: none Induration, erythema: none Nodules: none palpable  EYES Conjunctiva and lids: normal Pupils: equal and reactive Iris: normal bilaterally  EARS, NOSE, MOUTH, THROAT External ears: no lesion or deformity External nose: no lesion or deformity Hearing: grossly normal Lips: no lesion or deformity Dentition: normal for age Oral mucosa: moist  NECK Symmetric: yes Trachea: midline Thyroid: no palpable nodules in the thyroid bed  CHEST Respiratory effort: normal Retraction or accessory muscle use: no Breath sounds: normal bilaterally Rales, rhonchi, wheeze: none  CARDIOVASCULAR Auscultation: regular rhythm, normal rate Murmurs: none Pulses: carotid and radial pulse 2+ palpable Lower extremity edema: none Lower extremity varicosities: none  ABDOMEN Distension: none Masses: none palpable Tenderness: Mild right upper quadrant  tenderness to deep palpation Hepatosplenomegaly: not present Hernia: not present  MUSCULOSKELETAL Station and gait: normal Digits and nails: no clubbing or cyanosis Muscle strength: grossly normal all extremities Range  of motion: grossly normal all extremities Deformity: none  LYMPHATIC Cervical: none palpable Supraclavicular: none palpable  PSYCHIATRIC Oriented to person, place, and time: yes Mood and affect: normal for situation Judgment and insight: appropriate for situation    Assessment & Plan  CHOLELITHIASIS WITH CHRONIC CHOLECYSTITIS (K80.10)  Pt Education - Pamphlet Given - Laparoscopic Gallbladder Surgery: discussed with patient and provided information.  Patient presents with a recent history of intermittent episodes of biliary colic. These are occurring more frequently. She is provided with written literature on laparoscopic gallbladder surgery to review at home.  I have recommended proceeding with laparoscopic cholecystectomy with intraoperative cholangiography. We discussed the risk and benefits of the procedure including the potential for conversion to open surgery. We would plan on an overnight hospital stay since the patient is from out of town. We discussed her recovery time and return to work. Patient understands and wishes to proceed with surgery in the near future.  The risks and benefits of the procedure have been discussed at length with the patient. The patient understands the proposed procedure, potential alternative treatments, and the course of recovery to be expected. All of the patient's questions have been answered at this time. The patient wishes to proceed with surgery.  Darnell Level, MD [facility] Surgery Office: [phone]

## 2017-06-21 NOTE — Op Note (Signed)
Procedure Note  Pre-operative Diagnosis:  Chronic cholecystitis, cholelithiasis  Post-operative Diagnosis:  same  Surgeon:  Darnell Levelodd Ector Laurel, MD  Assistant:  Jaclynn GuarneriBen Hoxworth, MD   Procedure:  Laparoscopic cholecystectomy with intra-operative cholangiography  Anesthesia:  General  Estimated Blood Loss:  minimal  Drains: none         Specimen: Gallbladder to pathology  Indications:  Patient is self-referred. [facility] is [facility] from which she was also referred for consideration for cholecystectomy. Patient had developed symptoms of biliary colic during her pregnancy. Her child was born February 11, 2017. Patient has continued to have episodes of biliary colic on a weekly basis. She did undergo an abdominal ultrasound which documented multiple gallstones. There is no family history of gallbladder disease. The patient's only prior abdominal surgery was cesarean section. This was her first child. Patient notes chills nausea and epigastric abdominal pain associated with her episodes of biliary colic. She denies jaundice or acholic stools. She denies fever. The patient now presents for consideration of cholecystectomy for treatment of symptomatic cholelithiasis and probable chronic cholecystitis.  Procedure Details:  The patient was seen in the pre-op holding area. The risks, benefits, complications, treatment options, and expected outcomes were previously discussed with the patient. The patient agreed with the proposed plan and has signed the informed consent form.  The patient was brought to the Operating Room, identified as [name] [name] and the procedure verified as laparoscopic cholecystectomy with intraoperative cholangiography. A "time out" was completed and the above information confirmed.  The patient was placed in the supine position. Following induction of general anesthesia, the abdomen was prepped and draped in the usual aseptic fashion.  An incision was made in the  skin near the umbilicus. The midline fascia was incised and the peritoneal cavity was entered and a Hasson canula was introduced under direct vision.  The Hasson canula was secured with a 0-Vicryl pursestring suture. Pneumoperitoneum was established with carbon dioxide. Additional trocars were introduced under direct vision along the right costal margin in the midline, mid-clavicular line, and anterior axillary line.   The gallbladder was identified and the fundus grasped and retracted cephalad. Adhesions were taken down bluntly and the electrocautery was utilized as needed, taking care not to injure any adjacent structures. The infundibulum was grasped and retracted laterally, exposing the peritoneum overlying the triangle of Calot. The peritoneum was incised and structures exposed with blunt dissection. The cystic duct was clearly identified, bluntly dissected circumferentially, and clipped at the neck of the gallbladder.  An incision was made in the cystic duct and the cholangiogram catheter introduced. The catheter was secured using an ligaclip.  Real-time cholangiography was performed using C-arm fluoroscopy.  There was rapid filling of a normal caliber common bile duct.  There was reflux of contrast into the left and right hepatic ductal systems.  There was free flow distally into the duodenum without filling defect or obstruction.  The catheter was removed from the peritoneal cavity.  The cystic duct was then ligated with surgical clips and divided. The cystic artery was identified, dissected circumferentially, ligated with ligaclips, and divided.  The gallbladder was dissected away from the liver bed using the electrocautery for hemostasis. The gallbladder was completely removed from the liver and placed into an endocatch bag. The gallbladder was removed in the endocatch bag through the umbilical port site and submitted to pathology for review.  The right upper quadrant was irrigated and the  gallbladder bed was inspected. Hemostasis was achieved with the  electrocautery.  Pneumoperitoneum was released after viewing removal of the trocars with good hemostasis noted. The umbilical wound was irrigated and the fascia was then closed with the pursestring suture.  Local anesthetic was infiltrated at all port sites. The skin incisions were closed with 4-0 Monocril subcuticular sutures and steri-strips and dressings were applied.  Instrument, sponge, and needle counts were correct at the conclusion of the case.  The patient was awakened from anesthesia and brought to the recovery room in stable condition.  The patient tolerated the procedure well.   Darnell Level, MD [facility] Office: [phone]

## 2017-06-21 NOTE — Transfer of Care (Signed)
Immediate Anesthesia Transfer of Care Note  Patient: [name] [name]  Procedure(s) Performed: LAPAROSCOPIC CHOLECYSTECTOMY WITH INTRAOPERATIVE CHOLANGIOGRAM (N/A Abdomen)  Patient Location: PACU  Anesthesia Type:General  Level of Consciousness: awake, alert  and patient cooperative  Airway & Oxygen Therapy: Patient Spontanous Breathing and Patient connected to face mask oxygen  Post-op Assessment: Report given to RN and Post -op Vital signs reviewed and stable  Post vital signs: stable  Last Vitals:  Vitals:   06/21/17 0602 06/21/17 0603  BP:  119/65  Pulse:  75  Resp: 16 16  Temp:  36.7 C  SpO2:  100%    Last Pain:  Vitals:   06/21/17 0616  TempSrc:   PainSc: 0-No pain      Patients Stated Pain Goal: 4 (06/21/17 0616)  Complications: No apparent anesthesia complications

## 2017-06-21 NOTE — Discharge Instructions (Addendum)
PATIENT INSTRUCTIONS GALL BLADDER SURGERY (CHOLECYSTECTOMY)  FOLLOW-UP:  Please make an appointment with your physician as instructed. Call your physician immediately if you have any fevers greater than 102.5, drainage from your wound that is not clear or looks infected, persistent bleeding, increasing abdominal pain, problems urinating, or persistent nausea/vomiting.  You should be aware that you may have right shoulder pain after surgery and that this will progressively go away.  This is called 'referred pain' and is from the area of the gallbladder.  It can also be caused by gas that may be trapped under the diaphragm from the surgery, especially if it was performed laparoscopically through mini-incisions.  This gas will progressively get reabsorbed by your body.   WOUND CARE INSTRUCTIONS:  Keep a dry clean dressing on the wound if there is drainage. The initial bandage may be removed after 24 hours.  Once the wound has quit draining you may leave it open to air.  If clothing rubs against the wound or causes irritation and the wound is not draining you may cover it with a dry dressing during the daytime.  Try to keep the wound dry and avoid ointments on the wound unless directed to do so.  If the wound becomes bright red and painful or starts to drain infected material that is not clear, please contact your physician immediately.   You should also call if you begin to drain fluid that is thin and greenish-brown from the wound and appears to look like bile.  If the wound though is mildly pink and has a thick firm ridge underneath it, this is normal, and is referred to as a healing ridge.  This will resolve over the next 4-6 weeks.  DIET:  You may eat any foods that you can tolerate.  It is a good idea to eat a high fiber diet and take in plenty of fluids to prevent constipation.  If you do become constipated you may want to take a mild laxative or take ducolax tablets on a daily basis until your bowel  habits are regular.  Constipation can be very uncomfortable, along with straining, after recent abdominal surgery.  ACTIVITY:  You are encouraged to cough and deep breath or use your incentive spirometer if you were given one, every 15-30 minutes when awake.  This will help prevent respiratory complications and low grade fevers post-operatively.  You may want to hug a pillow when coughing and sneezing to add additional support to the surgical area(s) which will decrease pain during these times.  You are encouraged to walk and engage in light activity for the next two weeks.  You should not lift more than 20 pounds during this time frame as it could put you at increased risk for a post-operative hernia.  Twenty pounds is roughly equivalent to a plastic bag of groceries.    MEDICATIONS:  Try to take narcotic medications and anti-inflammatory medications, such as tylenol, ibuprofen, naprosyn, etc., with food.  This will minimize stomach upset from the medication.  Should you develop nausea and vomiting from the pain medication, or develop a rash, please discontinue the medication and contact your physician.  You should not drive, make important decisions, or operate machinery when taking narcotic pain medication.  QUESTIONS:  Please feel free to call your physician or the hospital operator if you have any questions, and they will be glad to assist you.    General Anesthesia, Adult, Care After These instructions provide you with information about caring for  yourself after your procedure. Your health care provider may also give you more specific instructions. Your treatment has been planned according to current medical practices, but problems sometimes occur. Call your health care provider if you have any problems or questions after your procedure. What can I expect after the procedure? After the procedure, it is common to have:  Vomiting.  A sore throat.  Mental slowness.  It is common to  feel:  Nauseous.  Cold or shivery.  Sleepy.  Tired.  Sore or achy, even in parts of your body where you did not have surgery.  Follow these instructions at home: For at least 24 hours after the procedure:  Do not: ? Participate in activities where you could fall or become injured. ? Drive. ? Use heavy machinery. ? Drink alcohol. ? Take sleeping pills or medicines that cause drowsiness. ? Make important decisions or sign legal documents. ? Take care of children on your own.  Rest. Eating and drinking  If you vomit, drink water, juice, or soup when you can drink without vomiting.  Drink enough fluid to keep your urine clear or pale yellow.  Make sure you have little or no nausea before eating solid foods.  Follow the diet recommended by your health care provider. General instructions  Have a responsible adult stay with you until you are awake and alert.  Return to your normal activities as told by your health care provider. Ask your health care provider what activities are safe for you.  Take over-the-counter and prescription medicines only as told by your health care provider.  If you smoke, do not smoke without supervision.  Keep all follow-up visits as told by your health care provider. This is important. Contact a health care provider if:  You continue to have nausea or vomiting at home, and medicines are not helpful.  You cannot drink fluids or start eating again.  You cannot urinate after 8-12 hours.  You develop a skin rash.  You have fever.  You have increasing redness at the site of your procedure. Get help right away if:  You have difficulty breathing.  You have chest pain.  You have unexpected bleeding.  You feel that you are having a life-threatening or urgent problem. This information is not intended to replace advice given to you by your health care provider. Make sure you discuss any questions you have with your health care  provider. Document Released: 06/28/2000 Document Revised: 08/25/2015 Document Reviewed: 03/06/2015 Elsevier Interactive Patient Education  [employer].

## 2017-06-21 NOTE — Anesthesia Postprocedure Evaluation (Signed)
Anesthesia Post Note  Patient: [name] [name]  Procedure(s) Performed: LAPAROSCOPIC CHOLECYSTECTOMY WITH INTRAOPERATIVE CHOLANGIOGRAM (N/A Abdomen)     Patient location during evaluation: PACU Anesthesia Type: General Level of consciousness: awake and alert, patient cooperative and oriented Pain management: pain level controlled Vital Signs Assessment: post-procedure vital signs reviewed and stable Respiratory status: spontaneous breathing, nonlabored ventilation and respiratory function stable Cardiovascular status: blood pressure returned to baseline and stable Postop Assessment: no apparent nausea or vomiting Anesthetic complications: no    Last Vitals:  Vitals:   06/21/17 1005 06/21/17 1042  BP: 137/84 132/81  Pulse: 78 76  Resp: 16 16  Temp:    SpO2: 100% 100%    Last Pain:  Vitals:   06/21/17 0945  TempSrc:   PainSc: 4                  Carolynne Schuchard,E. Etheridge Geil

## 2017-06-22 ENCOUNTER — Encounter (HOSPITAL_COMMUNITY): Payer: Self-pay | Admitting: Surgery

## 2017-06-22 NOTE — Progress Notes (Signed)
Nursing Note: Performed chart audit, review per AD request

## 2017-06-23 NOTE — Progress Notes (Signed)
Please contact patient and notify of benign pathology results.  Liboria Putnam M. Lechelle Wrigley, MD, FACS [facility] Office: [phone]   

## 2017-09-02 DIAGNOSIS — N760 Acute vaginitis: Secondary | ICD-10-CM | POA: Diagnosis not present

## 2017-09-02 DIAGNOSIS — N390 Urinary tract infection, site not specified: Secondary | ICD-10-CM | POA: Diagnosis not present

## 2017-09-02 DIAGNOSIS — R300 Dysuria: Secondary | ICD-10-CM | POA: Diagnosis not present

## 2017-12-19 ENCOUNTER — Ambulatory Visit: Payer: BLUE CROSS/BLUE SHIELD | Admitting: Family Medicine

## 2017-12-20 ENCOUNTER — Ambulatory Visit: Payer: BLUE CROSS/BLUE SHIELD | Admitting: Emergency Medicine

## 2018-01-13 IMAGING — US US MFM OB TRANSVAGINAL
1 series · 12 of 28 positions shown · non-contrast
Comparison: none

[Series 1: us mfm ob transvaginal · 12 of 99 slices shown]
[im 4/99]
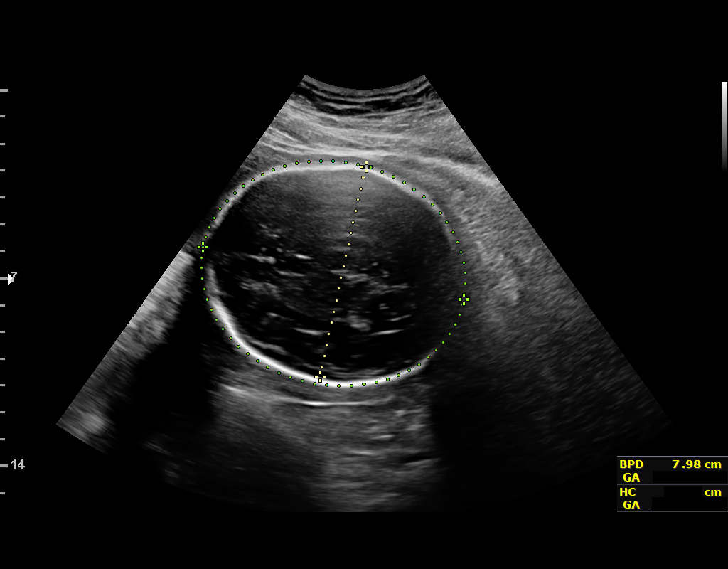
[im 11/99]
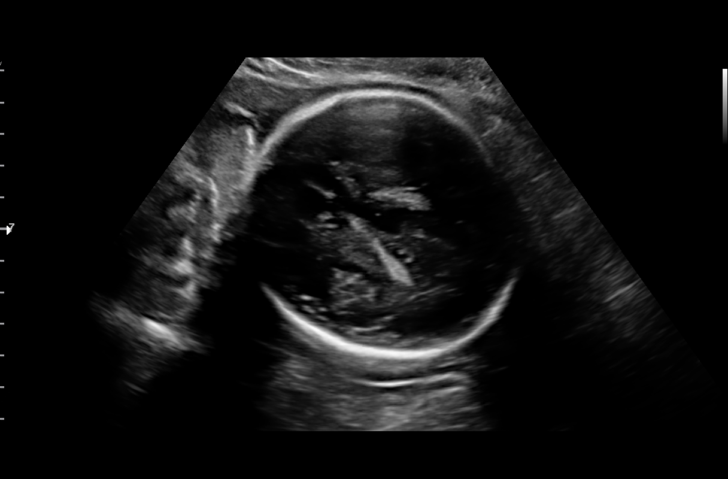
[im 19/99]
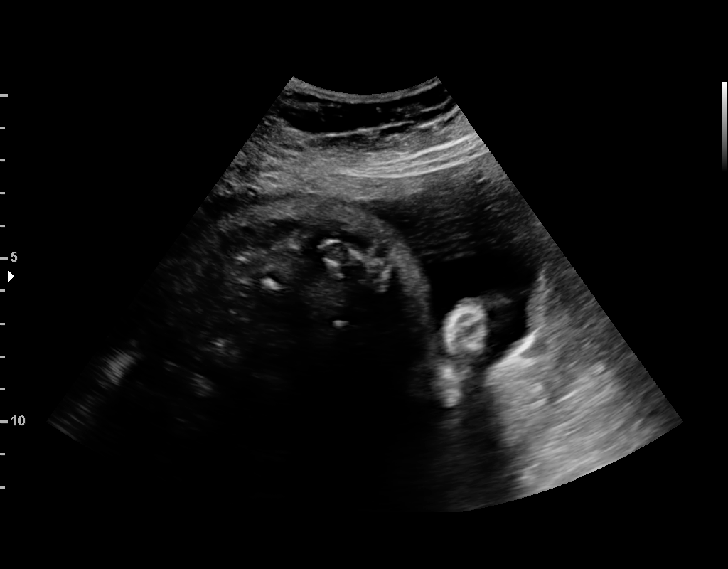
[im 30/99]
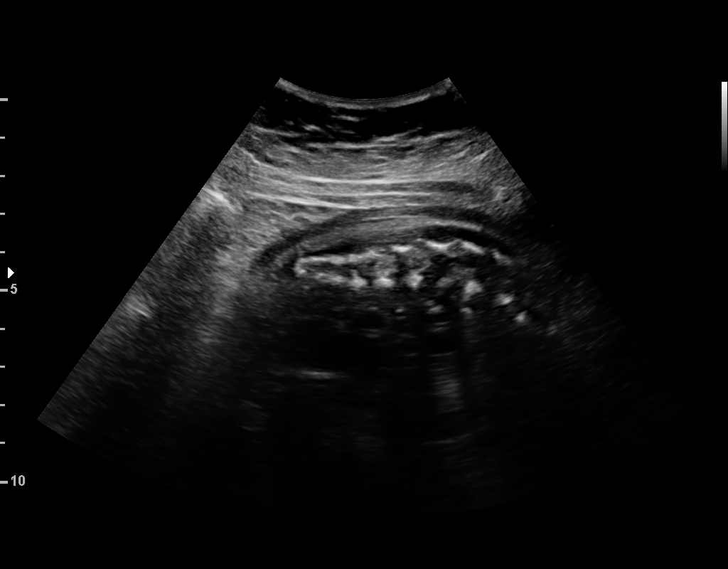
[im 37/99]
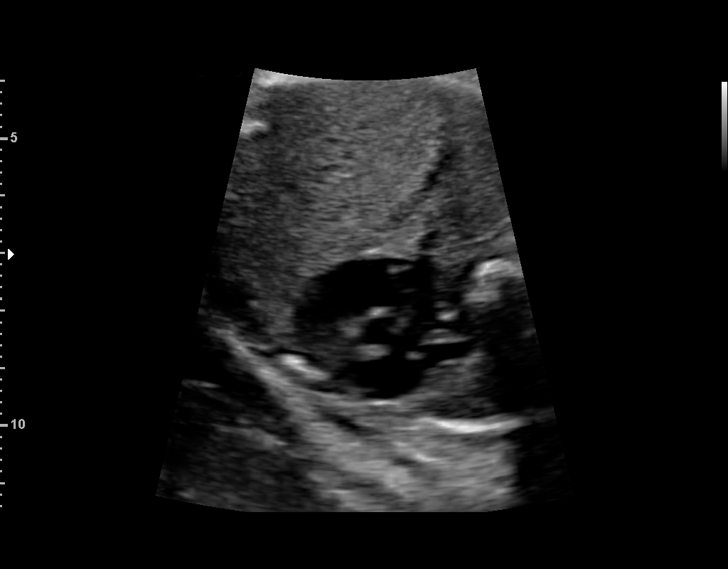
[im 44/99]
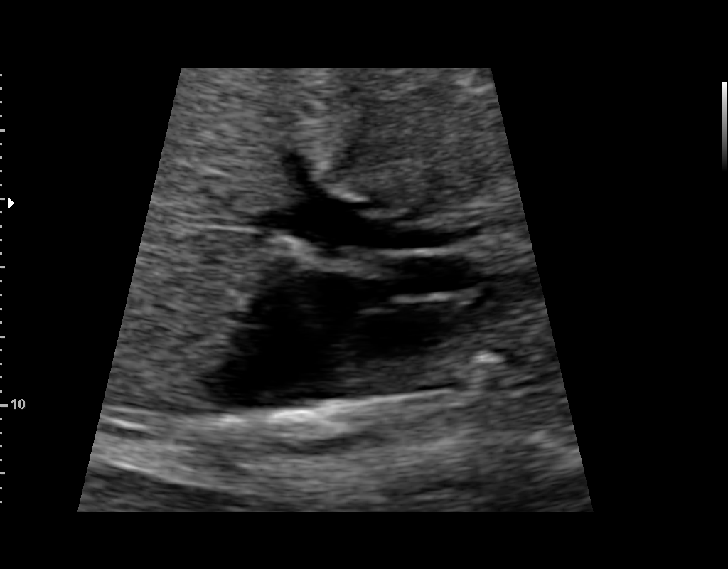
[im 55/99]
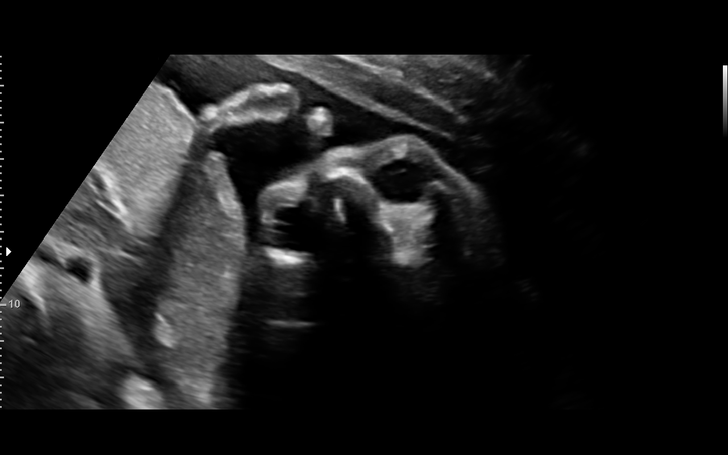
[im 62/99]
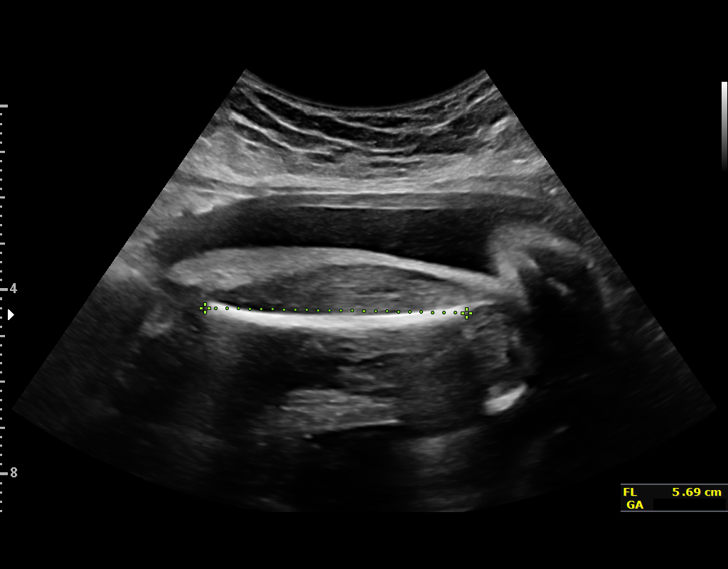
[im 69/99]
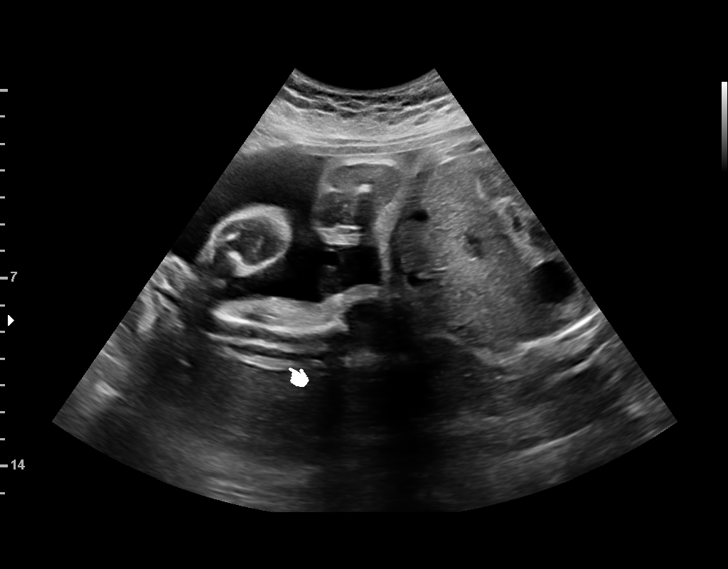
[im 80/99]
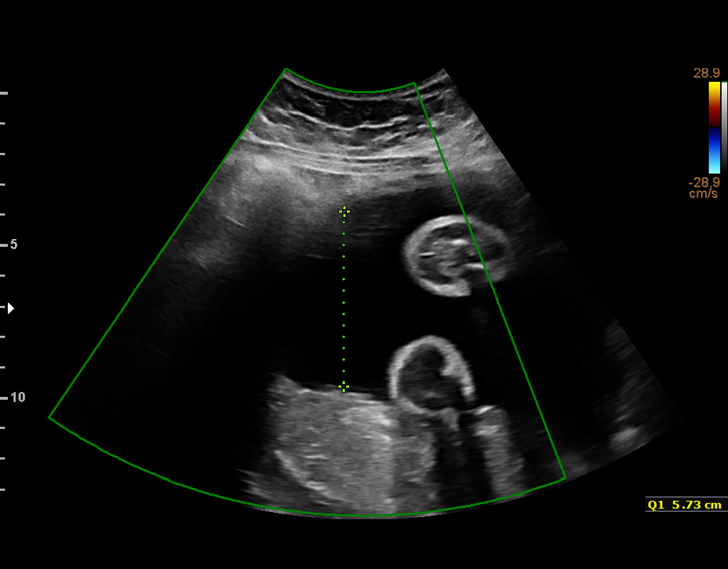
[im 88/99]
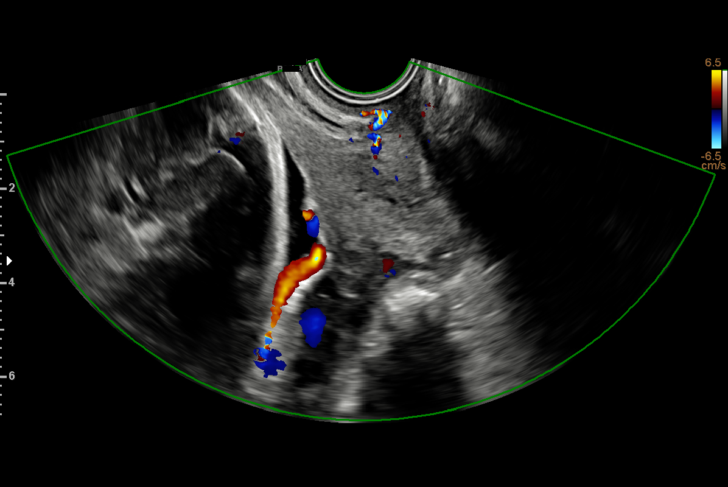
[im 95/99]
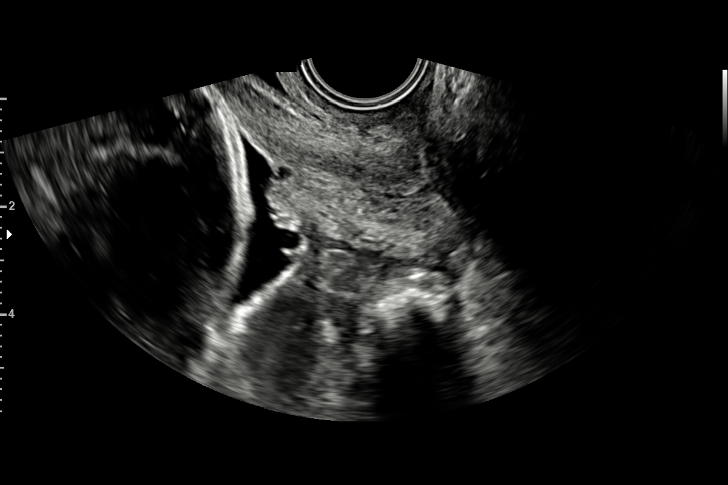

[12 of 28 positions shown; findings below may reference images not displayed]

& Infertility
0472 [REDACTED]

1  EROBIAM BEDJA           [id number]      [id number]     [id number]
2  WARDELL DAMIAN              [id number]      [id number]     [id number]
Indications

30 weeks gestation of pregnancy
Vasa previa
Encounter for fetal anatomic survey
Obesity complicating pregnancy, third
trimester
OB History

Blood Type:            Height:  5'4"   Weight (lb):  205       BMI:
Gravidity:    1
Fetal Evaluation

Num Of Fetuses:     1
Fetal Heart         132
Rate(bpm):
Cardiac Activity:   Observed
Presentation:       Cephalic
Placenta:           Posterior,
P. Cord Insertion:  Not well visualized

Amniotic Fluid
AFI FV:      Subjectively within normal limits

AFI Sum(cm)     %Tile       Largest Pocket(cm)
12.89           37

RUQ(cm)       RLQ(cm)       LUQ(cm)        LLQ(cm)
5.73          0
Biometry

BPD:      79.1  mm     G. Age:  31w 5d         75  %    CI:        76.79   %    70 - 86
FL/HC:      19.9   %    19.3 -
HC:      285.9  mm     G. Age:  31w 3d         38  %    HC/AC:      1.01        0.96 -
AC:      283.6  mm     G. Age:  32w 3d         90  %    FL/BPD:     71.9   %    71 - 87
FL:       56.9  mm     G. Age:  29w 6d         19  %    FL/AC:      20.1   %    20 - 24
HUM:      51.4  mm     G. Age:  30w 0d         42  %

Est. FW:    0009  gm    3 lb 15 oz      72  %
Gestational Age

LMP:           30w 4d        Date:  06/13/16                 EDD:   03/20/17
U/S Today:     31w 3d                                        EDD:   03/14/17
Best:          30w 4d     Det. By:  LMP  (06/13/16)          EDD:   03/20/17
Anatomy

Cranium:               Appears normal         Aortic Arch:            Not well visualized
Cavum:                 Appears normal         Ductal Arch:            Not well visualized
Ventricles:            Appears normal         Diaphragm:              Not well visualized
Choroid Plexus:        Appears normal         Stomach:                Appears normal, left
sided
Cerebellum:            Appears normal         Abdomen:                Appears normal
Posterior Fossa:       Appears normal         Abdominal Wall:         Not well visualized
Nuchal Fold:           Not applicable (>20    Cord Vessels:           Appears normal (3
wks GA)                                        vessel cord)
Face:                  Appears normal         Kidneys:                Appear normal
(orbits and profile)
Lips:                  Appears normal         Bladder:                Appears normal
Thoracic:              Appears normal         Spine:                  Appears normal
Heart:                 Appears normal         Upper Extremities:      Appears normal;
(4CH, axis, and                                hands nwv
situs)
RVOT:                  Appears normal         Lower Extremities:      Appears normal
LVOT:                  Appears normal

Other:  Fetus appears to be a male. Heels visualized. Technicallly difficult
due to advanced GA, fetal position, and maternal habitus.
Cervix Uterus Adnexa

Cervix
Length:            2.8  cm.
Measured transvaginally.
Impression

Singleton intrauterine pregnancy at 30+4 weeks with
suspected vasa previa
Review of the anatomy shows no sonographic markers for
aneuploidy or structural anomalies
However, anatomic survey should be considered suboptimal
secondary to late EGA and fetal position
Amniotic fluid volume is normal with an AFI of 13 cm
Estimated fetal weight is 1774g which is growth in the 72nd
percentile
Transvaginal evaluation of the cervix shows 30mm cervical
lenght and fetal vessels transversing the internal os. they do
not appear to be free-floating. Doppler confirms fetal origin
Recommendations

Vasa previa is confirmed. See MFM consult

## 2018-03-04 IMAGING — US US ABDOMEN COMPLETE
1 series · 15 of 25 positions shown · non-contrast
Comparison: None.

CLINICAL DATA: Acute right upper quadrant pain. Recent Caesarean
section.

EXAM:
ABDOMEN ULTRASOUND COMPLETE

[Series 1: us abdomen complete · 15 of 83 slices shown]
[im 1/83]
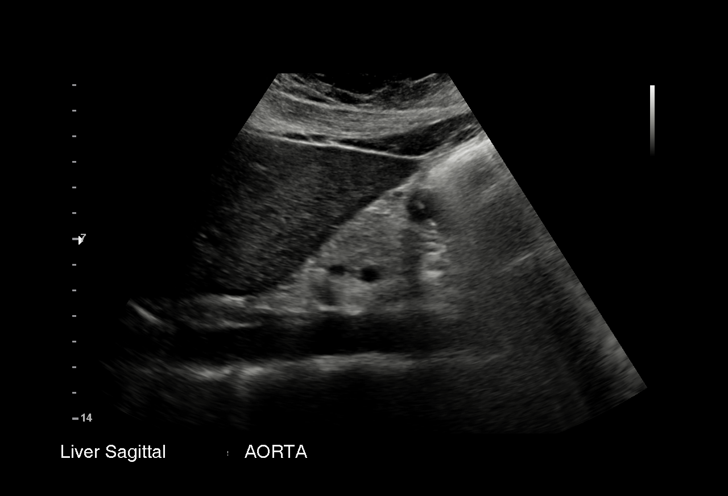
[im 7/83]
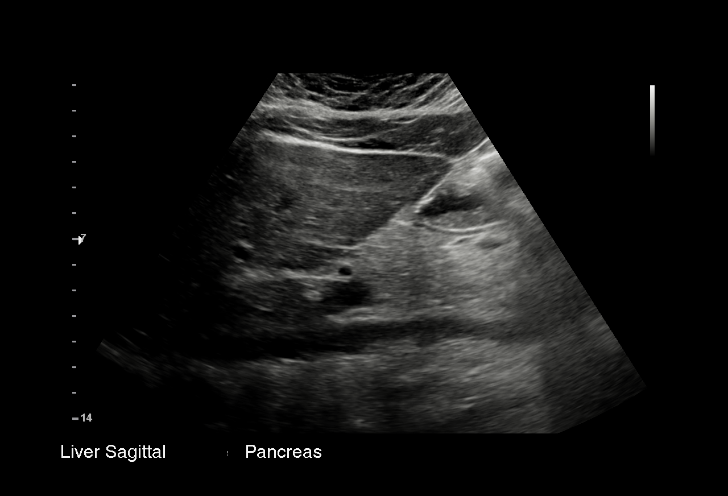
[im 14/83]
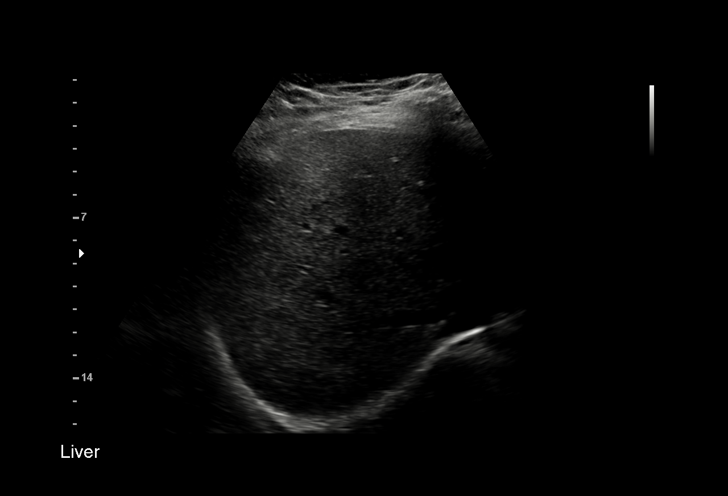
[im 18/83]
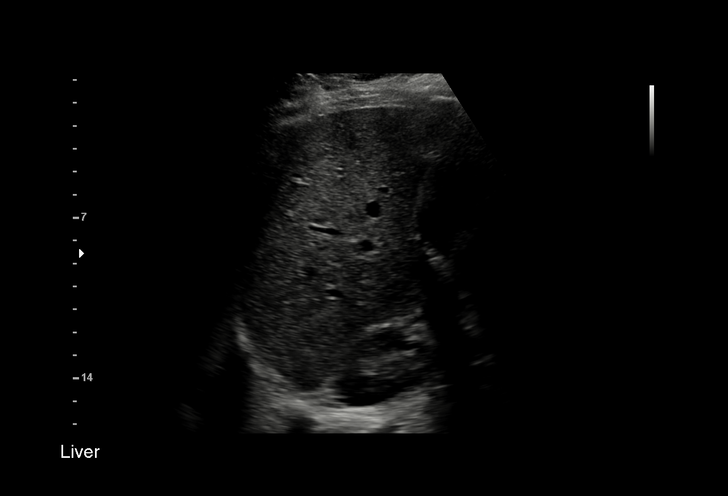
[im 24/83]
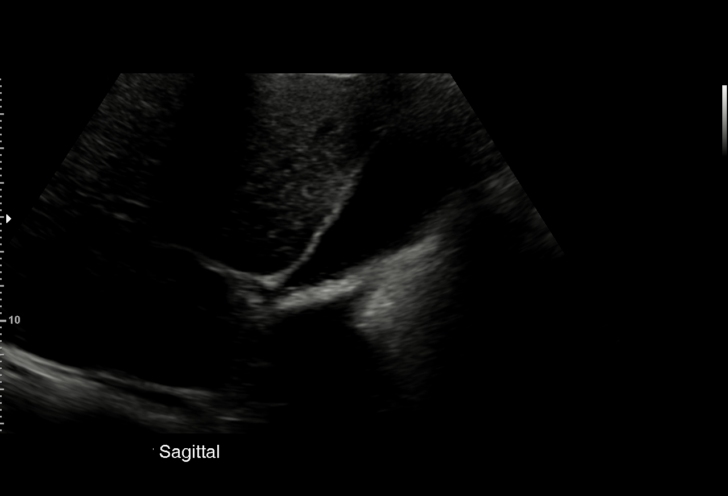
[im 31/83]
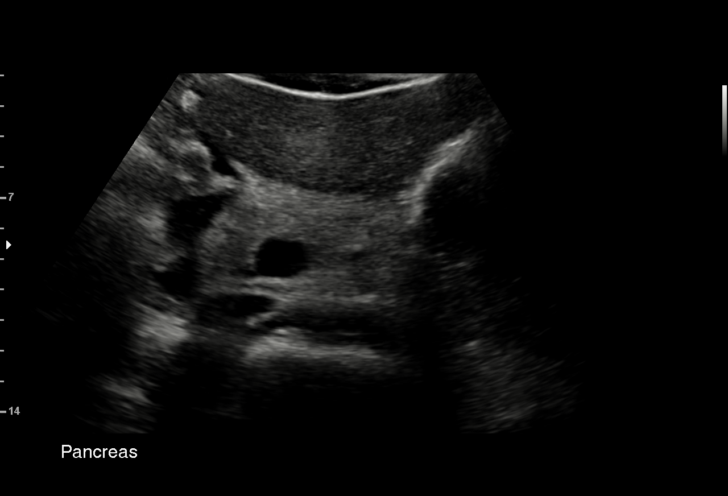
[im 35/83]
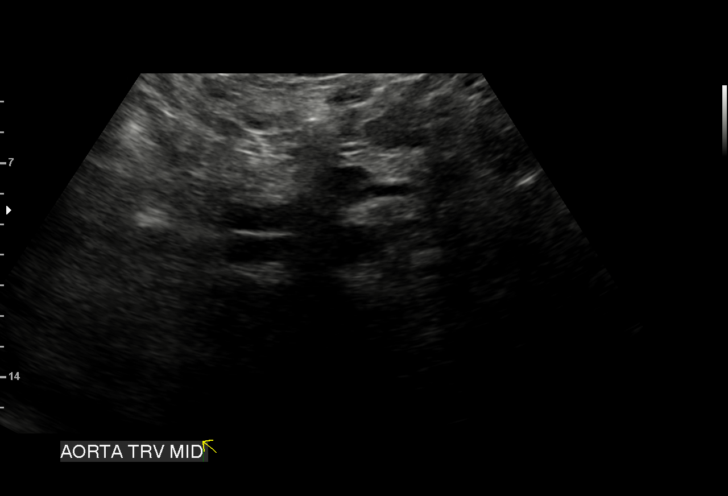
[im 42/83]
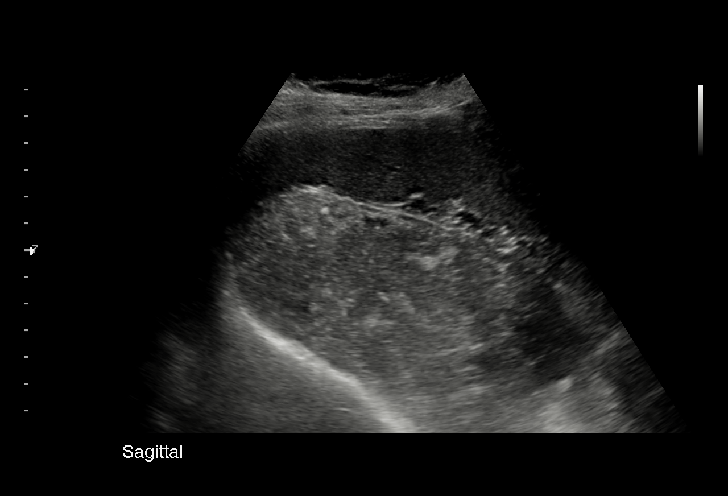
[im 48/83]
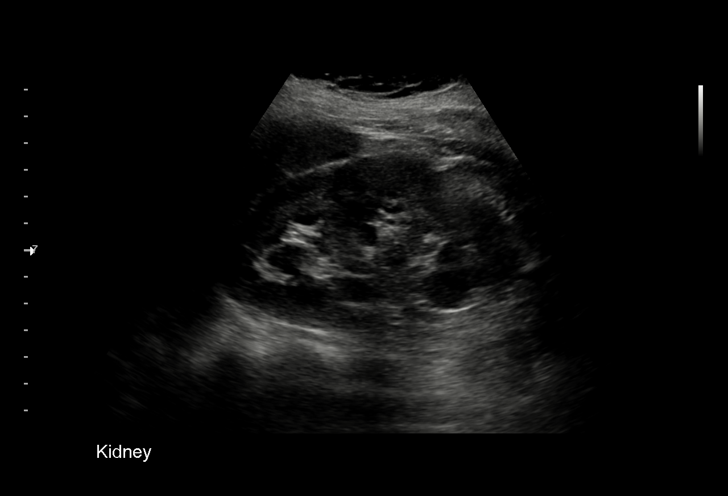
[im 52/83]
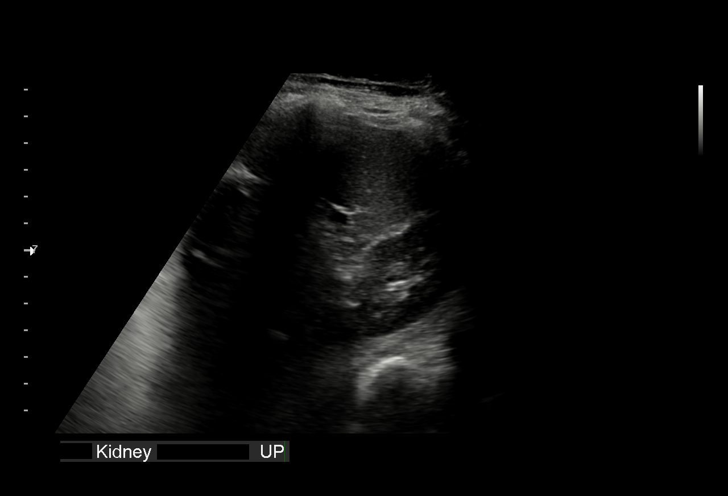
[im 59/83]
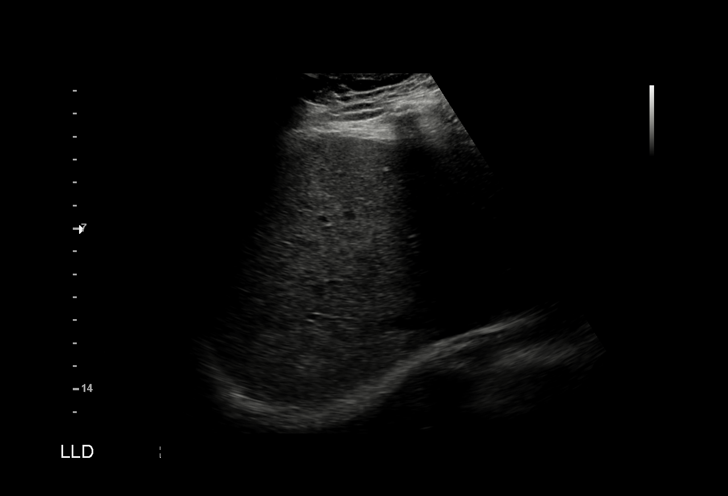
[im 65/83]
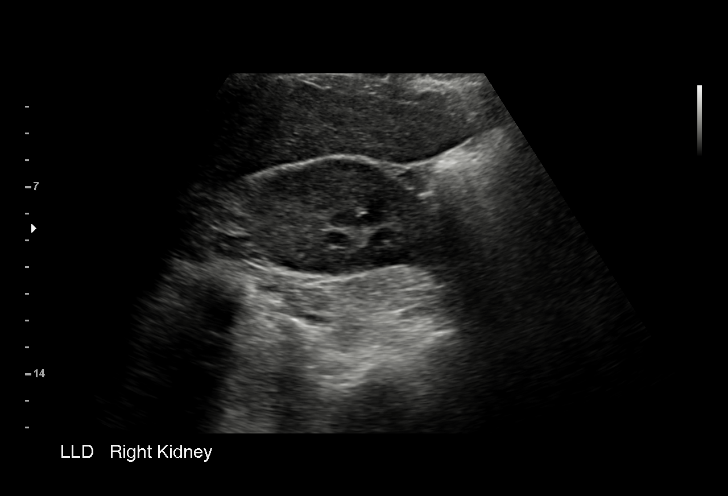
[im 69/83]
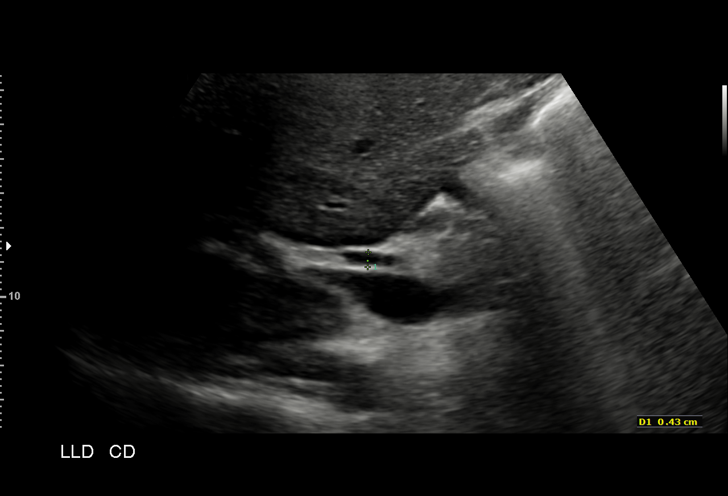
[im 76/83]
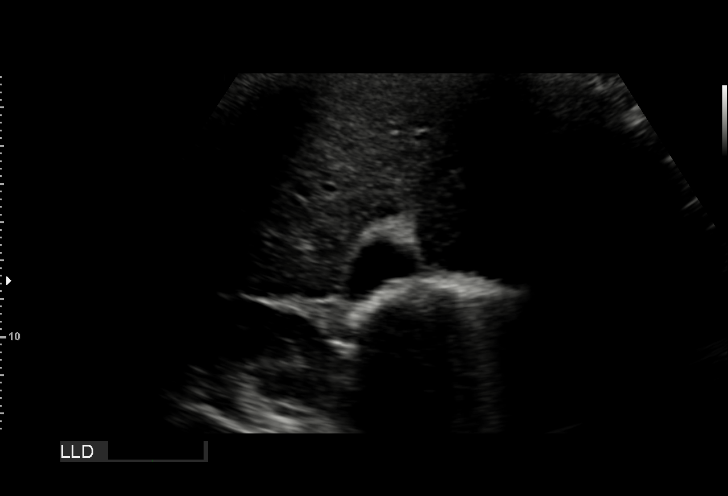
[im 83/83]
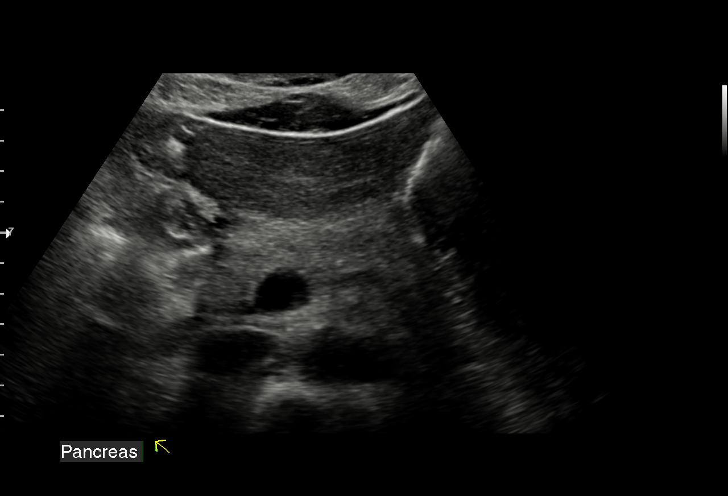

[15 of 25 positions shown; findings below may reference images not displayed]

FINDINGS: Gallbladder: Physiologically distended. Multiple shadowing
gallstones in the gallbladder neck. No gallbladder wall thickening
or pericholecystic fluid. No sonographic Murphy sign noted by
sonographer.

Common bile duct: Diameter: 4 mm, normal.

Liver: No focal lesion identified. Within normal limits in
parenchymal echogenicity. Portal vein is patent on color Doppler
imaging with normal direction of blood flow towards the liver.

IVC: No abnormality visualized.

Pancreas: Visualized portion unremarkable.

Spleen: Size and appearance within normal limits.

Right Kidney: Length: 11.3 cm. Echogenicity within normal limits. No
mass or hydronephrosis visualized.

Left Kidney: Length: 11.6 cm. Echogenicity within normal limits. No
mass or hydronephrosis visualized.

Abdominal aorta: No aneurysm visualized.

Other findings: None.  No ascites.
IMPRESSION: 1. Cholelithiasis without sonographic findings of acute
cholecystitis.
2. Otherwise unremarkable abdominal ultrasound.

## 2018-04-28 ENCOUNTER — Emergency Department (HOSPITAL_COMMUNITY)
Admission: EM | Admit: 2018-04-28 | Discharge: 2018-04-28 | Disposition: A | Discharge status: Home / Self Care | Payer: BLUE CROSS/BLUE SHIELD | Attending: Emergency Medicine | Admitting: Emergency Medicine

## 2018-04-28 ENCOUNTER — Encounter (HOSPITAL_COMMUNITY): Payer: Self-pay

## 2018-04-28 ENCOUNTER — Other Ambulatory Visit: Payer: Self-pay

## 2018-04-28 DIAGNOSIS — R0602 Shortness of breath: Secondary | ICD-10-CM | POA: Diagnosis not present

## 2018-04-28 DIAGNOSIS — J40 Bronchitis, not specified as acute or chronic: Secondary | ICD-10-CM | POA: Diagnosis not present

## 2018-04-28 DIAGNOSIS — R0981 Nasal congestion: Secondary | ICD-10-CM | POA: Diagnosis not present

## 2018-04-28 DIAGNOSIS — R51 Headache: Secondary | ICD-10-CM | POA: Insufficient documentation

## 2018-04-28 DIAGNOSIS — R070 Pain in throat: Secondary | ICD-10-CM | POA: Diagnosis not present

## 2018-04-28 DIAGNOSIS — Z87891 Personal history of nicotine dependence: Secondary | ICD-10-CM | POA: Insufficient documentation

## 2018-04-28 DIAGNOSIS — R05 Cough: Secondary | ICD-10-CM | POA: Insufficient documentation

## 2018-04-28 MED ORDER — BENZONATATE 100 MG PO CAPS: 100.0000 mg | ORAL_CAPSULE | Freq: Three times a day (TID) | ORAL | 0 refills | Status: DC

## 2018-04-28 MED ORDER — FLUTICASONE PROPIONATE 50 MCG/ACT NA SUSP: 1.0000 | Freq: Every day | NASAL | 0 refills | Status: DC

## 2018-04-28 MED ORDER — PREDNISONE 20 MG PO TABS: 40.0000 mg | ORAL_TABLET | Freq: Every day | ORAL | 0 refills | Status: AC

## 2018-04-28 MED ORDER — AEROCHAMBER PLUS FLO-VU MEDIUM MISC
1.0000 | Freq: Once | Status: DC
Start: 2018-04-28 — End: 2018-04-29
  Filled 2018-04-28: qty 1

## 2018-04-28 MED ORDER — ALBUTEROL SULFATE HFA 108 (90 BASE) MCG/ACT IN AERS
1.0000 | INHALATION_SPRAY | Freq: Once | RESPIRATORY_TRACT | Status: AC
  Administered 2018-04-28: 1 via RESPIRATORY_TRACT
  Filled 2018-04-28: qty 6.7

## 2018-04-28 MED ORDER — PREDNISONE 10 MG PO TABS: 20.0000 mg | ORAL_TABLET | Freq: Every day | ORAL | 0 refills | Status: DC

## 2018-04-28 MED ORDER — AEROCHAMBER PLUS FLO-VU LARGE MISC: 1.0000 | Freq: Once | Status: DC

## 2018-04-28 MED ORDER — DEXAMETHASONE SODIUM PHOSPHATE 10 MG/ML IJ SOLN
10.0000 mg | Freq: Once | INTRAMUSCULAR | Status: AC
  Administered 2018-04-28: 10 mg via INTRAMUSCULAR
  Filled 2018-04-28: qty 1

## 2018-04-28 NOTE — ED Triage Notes (Signed)
Pt here with a sore throat and nasal congestion for the last 2 days. Taking tylenol and mucinex for symptoms.  Afebrile in triage.

## 2018-04-28 NOTE — ED Provider Notes (Signed)
[facility]   CSN: [id number] Arrival date & time: 04/28/18  2028     History   Chief Complaint Chief Complaint  Patient presents with  . Sore Throat  . Nasal Congestion    HPI [name] [name] is a 23 y.o. female presenting for evaluation of sore throat, nasal congestion, cough.  Patient states for the past week, she has been feeling poorly.  Initially she developed fevers, chills, sore throat, nasal congestion, headache, and cough.  Her headache and fever has resolved, but she has residual congestion, cough, and shortness of breath.  Patient states it feels like there is a band around her chest and she cannot take a deep breath in.  Her cough is producing blood-streaked sputum.  He denies history of asthma or COPD, but states that she gets bronchitis frequently.  This is the worst it has been.  She reports her son was recently diagnosed with bronchitis, has similar symptoms.  She denies tobacco use.  She has no medical problems, takes medications daily.  She has been using Mucinex without improvement of symptoms.  HPI  Past Medical History:  Diagnosis Date  . Gallstones   . Medical history non-contributory     Patient Active Problem List   Diagnosis Date Noted  . Cholelithiasis with chronic cholecystitis 06/21/2017  . Cough 06/01/2017  . Generalized body aches 06/01/2017  . Sinus congestion 06/01/2017  . Viral respiratory illness 06/01/2017  . Acute blood loss anemia 02/14/2017  . Rubella non-immune status, antepartum 02/14/2017  . Cesarean delivery delivered / Indication: vasa previa 11/9 02/12/2017  . Postpartum care following cesarean delivery (11/9) 02/12/2017  . Vasa previa 01/13/2017    Past Surgical History:  Procedure Laterality Date  . CESAREAN SECTION N/A 02/11/2017   Procedure: Primary CESAREAN SECTION;  Surgeon: Olivia Mackieaavon, Richard, MD;  [facility];  Service: Obstetrics;  Laterality: N/A;  EDD:  03/20/17  . CHOLECYSTECTOMY N/A 06/21/2017   Procedure: LAPAROSCOPIC CHOLECYSTECTOMY WITH INTRAOPERATIVE CHOLANGIOGRAM;  Surgeon: Darnell LevelGerkin, Todd, MD;  Location: [facility] ORS;  Service: General;  Laterality: N/A;  . NO PAST SURGERIES       OB History    Gravida  1   Para  1   Term      Preterm  1   AB      Living  1     SAB      TAB      Ectopic      Multiple  0   Live Births  1            Home Medications    Prior to Admission medications   Medication Sig Start Date End Date Taking? Authorizing Provider  benzonatate (TESSALON) 100 MG capsule Take 1 capsule (100 mg total) by mouth every 8 (eight) hours. 04/28/18   [name] Lumbra, [name] [name]  ferrous sulfate 325 (65 FE) MG tablet Take 1 tablet (325 mg total) daily by mouth. Patient not taking: Reported on 06/01/2017 02/14/17 02/14/18  Karena AddisonSigmon, Meredith C, CNM  fluticasone (FLONASE) 50 MCG/ACT nasal spray Place 1 spray into both nostrils daily. 04/28/18   Asaiah Hunnicutt, [name] [name]  ibuprofen (ADVIL,MOTRIN) 600 MG tablet Take 1 tablet (600 mg total) every 6 (six) hours by mouth. Patient not taking: Reported on 06/01/2017 02/14/17   Karena AddisonSigmon, Meredith C, CNM  oxyCODONE-acetaminophen (PERCOCET/ROXICET) 5-325 MG tablet Take 1 tablet every 4 (four) hours as needed by mouth (pain scale 4-7). Patient not taking: Reported on 06/01/2017  02/14/17   Sigmon, Scarlette SliceMeredith C, CNM  predniSONE (DELTASONE) 20 MG tablet Take 2 tablets (40 mg total) by mouth daily for 4 days. 04/28/18 05/02/18  Keshav Winegar, [name] [name]  triamcinolone (NASACORT) 55 MCG/ACT AERO nasal inhaler Place 2 sprays into the nose daily. Patient not taking: Reported on 06/14/2017 06/01/17   Georgina QuintSagardia, Miguel Jose, MD    Family History Family History  Problem Relation Age of Onset  . Heart disease Mother   . Hypertension Mother 5035       PULMONARY ARTERIAL HYPERTENSION  . Mental retardation Maternal Uncle     Social History Social History   Tobacco Use  . Smoking status: Former Smoker     Packs/day: 0.10    Years: 3.00    Pack years: 0.30    Types: Cigarettes  . Smokeless tobacco: Never Used  . Tobacco comment: quit 5 years ago  Substance Use Topics  . Alcohol use: Yes  . Drug use: No     Allergies   Patient has no known allergies.   Review of Systems Review of Systems  Constitutional: Positive for fever (resolved).  HENT: Positive for congestion and sore throat.   Respiratory: Positive for chest tightness and shortness of breath.   All other systems reviewed and are negative.    Physical Exam Updated Vital Signs BP 132/72 (BP Location: Right Arm)   Pulse 72   Temp 98.8 F (37.1 C) (Oral)   Resp 17   SpO2 100%   Physical Exam Vitals signs and nursing note reviewed.  Constitutional:      General: She is not in acute distress.    Appearance: She is well-developed.     Comments: Sitting in the bed in no acute distress  HENT:     Head: Normocephalic and atraumatic.     Comments: Nasal congestion/rhinorrhea.  OP with bilateral tonsillar swelling without exudate.  Uvula midline with palate rise.  TMs nonerythematous nonbulging bilaterally.    Right Ear: Tympanic membrane, ear canal and external ear normal.     Left Ear: Tympanic membrane, ear canal and external ear normal.     Nose: Congestion and rhinorrhea present. Rhinorrhea is clear.     Right Sinus: No maxillary sinus tenderness or frontal sinus tenderness.     Left Sinus: No maxillary sinus tenderness or frontal sinus tenderness.     Mouth/Throat:     Pharynx: Uvula midline.     Tonsils: No tonsillar exudate. Swelling: 2+ on the right. 2+ on the left.  Eyes:     Conjunctiva/sclera: Conjunctivae normal.     Pupils: Pupils are equal, round, and reactive to light.  Neck:     Musculoskeletal: Normal range of motion.  Cardiovascular:     Rate and Rhythm: Normal rate and regular rhythm.     Pulses: Normal pulses.  Pulmonary:     Effort: Pulmonary effort is normal.     Breath sounds: Normal  breath sounds. No decreased breath sounds, wheezing, rhonchi or rales.     Comments: Eating a full sentences.  Limited air movement in all fields.  No wheezing, rales, rhonchi Abdominal:     General: There is no distension.     Palpations: Abdomen is soft. There is no mass.     Tenderness: There is no abdominal tenderness. There is no rebound.  Musculoskeletal: Normal range of motion.  Lymphadenopathy:     Cervical: No cervical adenopathy.  Skin:    General: Skin is warm.     Capillary  Refill: Capillary refill takes less than 2 seconds.  Neurological:     Mental Status: She is alert and oriented to person, place, and time.      ED Treatments / Results  Labs (all labs ordered are listed, but only abnormal results are displayed) Labs Reviewed - No data to display  EKG None  Radiology No results found.  Procedures Procedures (including critical care time)  Medications Ordered in ED Medications  AEROCHAMBER PLUS FLO-VU MEDIUM MISC 1 each (1 each Other Refused 04/28/18 2144)  AEROCHAMBER PLUS FLO-VU LARGE MISC 1 each (1 each Other Not Given 04/28/18 2154)  albuterol (PROVENTIL HFA;VENTOLIN HFA) 108 (90 Base) MCG/ACT inhaler 1 puff (1 puff Inhalation Given 04/28/18 2145)  dexamethasone (DECADRON) injection 10 mg (10 mg Intramuscular Given 04/28/18 2144)     Initial Impression / Assessment and Plan / ED Course  I have reviewed the triage vital signs and the nursing notes.  Pertinent labs & imaging results that were available during my care of the patient were reviewed by me and considered in my medical decision making (see chart for details).     Pt presenting for evaluation of 1 week history of URI symptoms.  Physical examination, she is afebrile not tachycardic.  Appears nontoxic.  Fever has resolved, as such, low suspicion for pneumonia or other bacterial infection.  Residual cough, with limited air movement.  Consider bronchitis.  Also consider rhinosinusitis, with patient  significant nasal congestion.  Will give albuterol, Decadron, and reassess.  On reassessment, patient reports improvement of her shortness of breath.  On exam, improved air movement.  Discussed symptomatic treatment at home, and follow-up as needed.  At this time, patient appears safe for discharge.  Return precautions given.  Patient states she understands and agrees to plan.   Final Clinical Impressions(s) / ED Diagnoses   Final diagnoses:  Bronchitis    ED Discharge Orders         Ordered    predniSONE (DELTASONE) 10 MG tablet  Daily,   Status:  Discontinued     04/28/18 2216    benzonatate (TESSALON) 100 MG capsule  Every 8 hours,   Status:  Discontinued     04/28/18 2216    fluticasone (FLONASE) 50 MCG/ACT nasal spray  Daily     04/28/18 2216    predniSONE (DELTASONE) 20 MG tablet  Daily     04/28/18 2217    benzonatate (TESSALON) 100 MG capsule  Every 8 hours     04/28/18 2217           Alveria Apley, [name] [name] 04/28/18 2218    Vanetta Mulders, MD 04/30/18 1549

## 2018-04-28 NOTE — ED Notes (Signed)
Patient Alert and oriented to baseline. Stable and ambulatory to baseline. Patient verbalized understanding of the discharge instructions.  Patient belongings were taken by the patient.   

## 2018-04-28 NOTE — Discharge Instructions (Signed)
Use the inhaler every 4 hours for the next 2 days.  After this, use only as needed for shortness of breath, chest tightness, or cough. Use Tessalon Perles to help decrease cough.  You may also use over-the-counter cough syrups and drops. Take prednisone as prescribed.  Take entire course, even if your symptoms improve. Use Flonase to help with nasal congestion and postnasal drip. Follow-up with your family care doctor if your symptoms not proving the next week. Return to the emergency room if you develop increased difficulty breathing, worsening shortness of breath, or any new, worsening, concerning symptoms.

## 2018-06-21 IMAGING — RF DG CHOLANGIOGRAM OPERATIVE
1 series · 4 of 4 positions shown · non-contrast
Comparison: Right upper quadrant abdominal ultrasound-03/04/2017

CLINICAL DATA: Intraoperative cholangiogram during laparoscopic
cholecystectomy.

EXAM:
INTRAOPERATIVE CHOLANGIOGRAM
FLUOROSCOPY TIME:  11 seconds

[Series 1: run · 4 of 69 frames shown]
[frame 1/69]
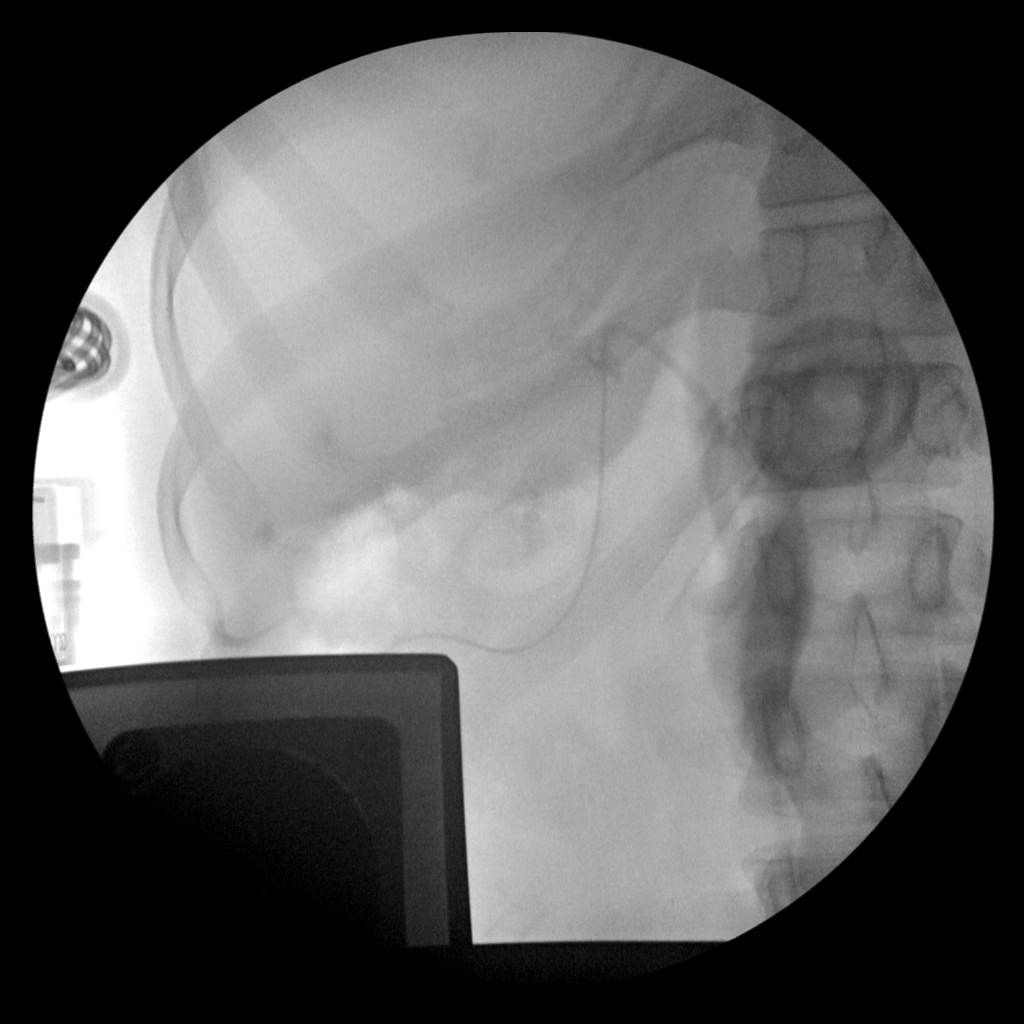
[frame 11/69]
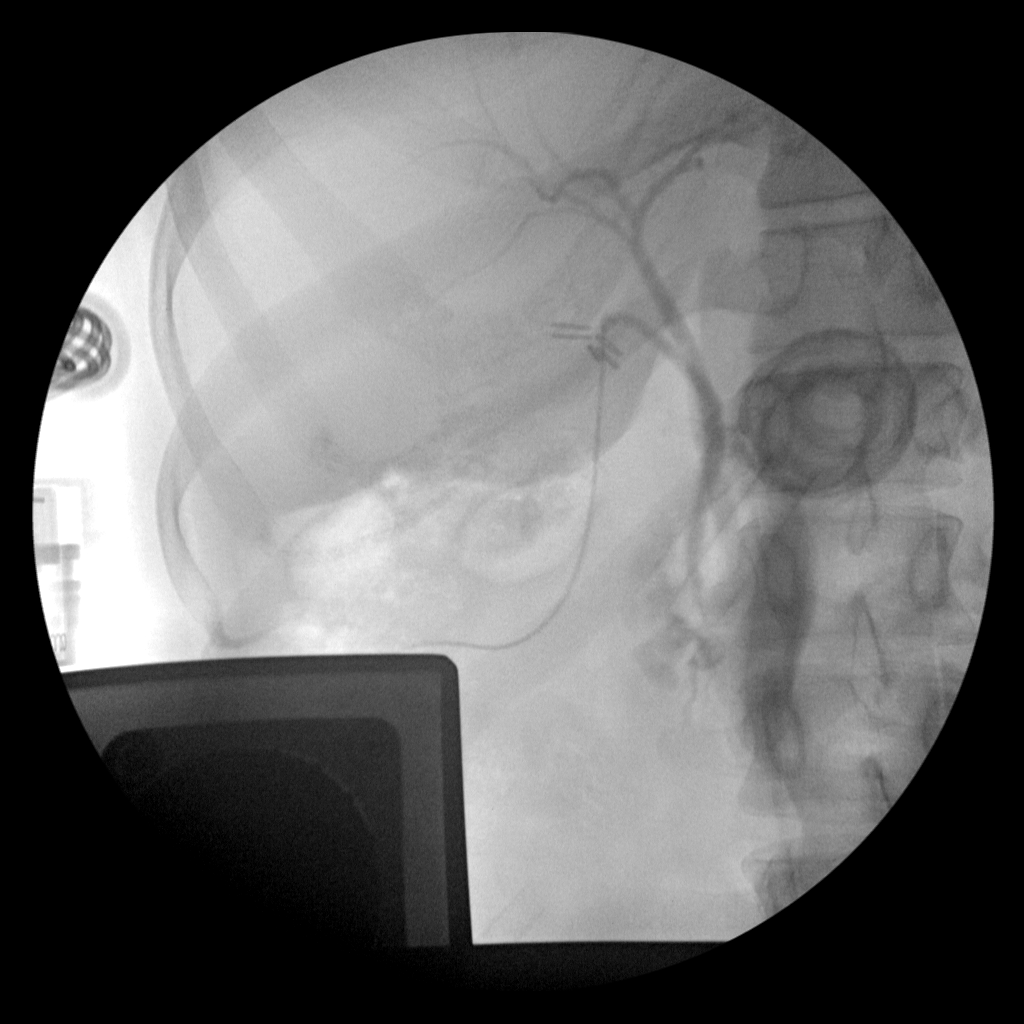
[frame 35/69]
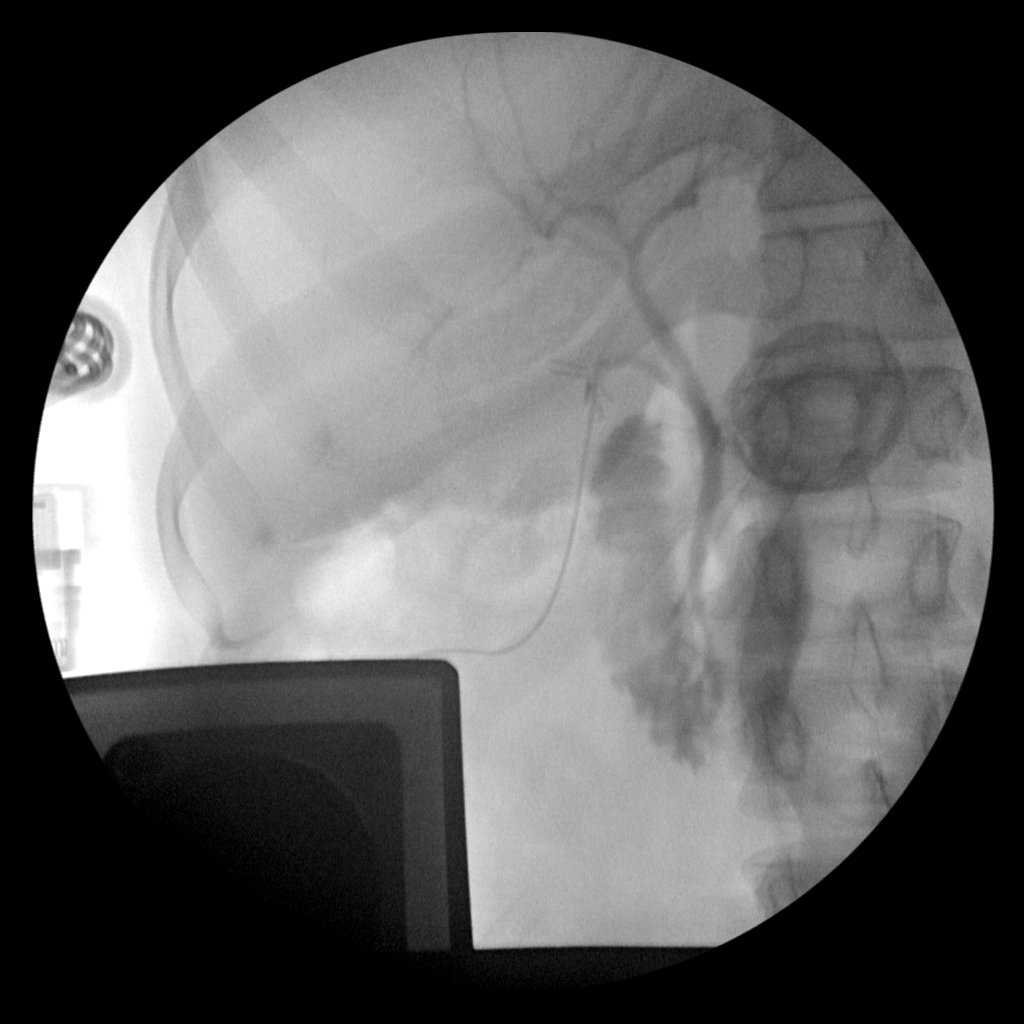
[frame 59/69]
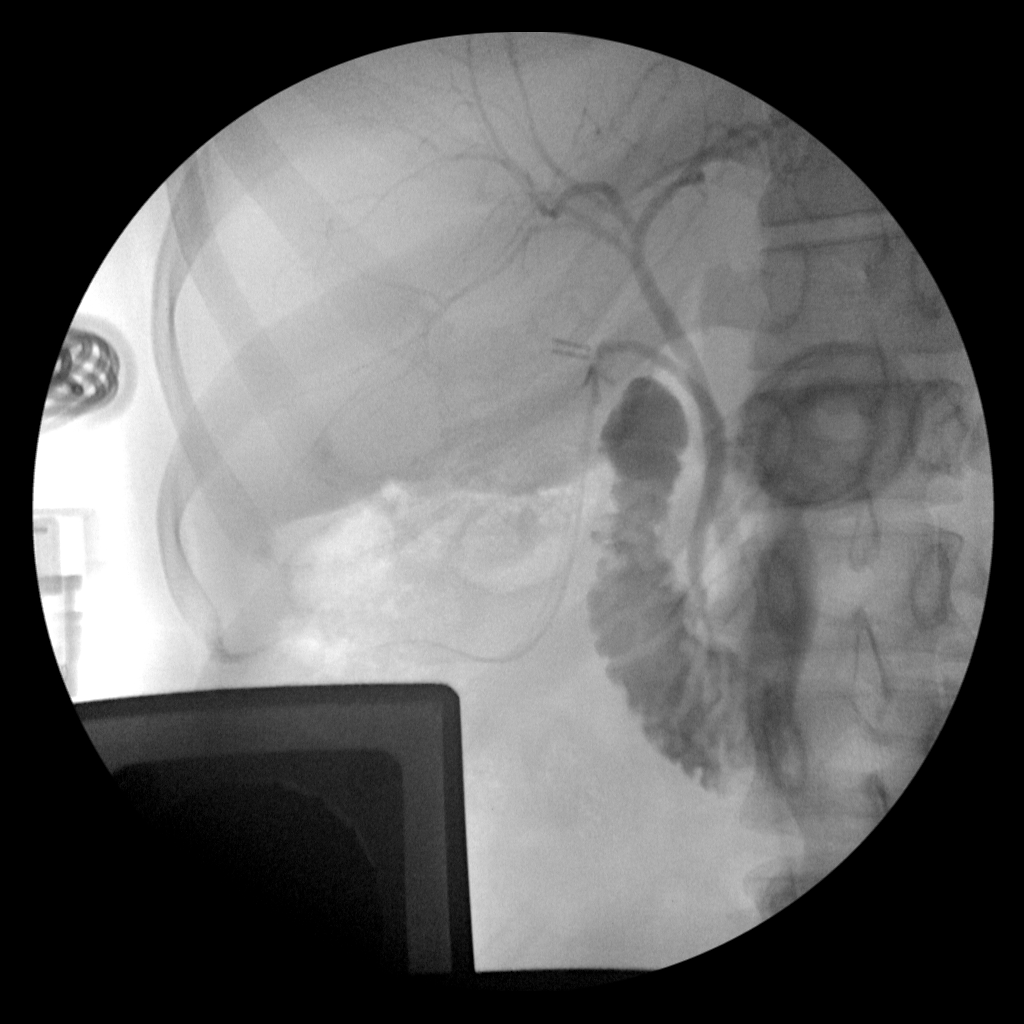

[4 of 4 positions shown; findings below may reference images not displayed]

FINDINGS: Intraoperative cholangiographic images of the right upper abdominal
quadrant during laparoscopic cholecystectomy are provided for
review.

Surgical clips overlie the expected location of the gallbladder
fossa.

Contrast injection demonstrates selective cannulation of the central
aspect of the cystic duct.

There is passage of contrast through the central aspect of the
cystic duct with filling of a non dilated common bile duct. There is
passage of contrast though the CBD and into the descending portion
of the duodenum.

There is minimal reflux of injected contrast into the common hepatic
duct and central aspect of the non dilated intrahepatic biliary
system.

There are no discrete filling defects within the opacified portions
of the biliary system to suggest the presence of
choledocholithiasis.
IMPRESSION: No evidence of choledocholithiasis.

## 2018-07-04 ENCOUNTER — Other Ambulatory Visit: Payer: Self-pay

## 2018-07-04 ENCOUNTER — Ambulatory Visit (INDEPENDENT_AMBULATORY_CARE_PROVIDER_SITE_OTHER): Payer: BLUE CROSS/BLUE SHIELD | Admitting: Family Medicine

## 2018-07-04 ENCOUNTER — Other Ambulatory Visit (HOSPITAL_COMMUNITY)
Admission: RE | Admit: 2018-07-04 | Discharge: 2018-07-04 | Disposition: A | Discharge status: Home / Self Care | Payer: BLUE CROSS/BLUE SHIELD | Source: Ambulatory Visit | Attending: Emergency Medicine | Admitting: Emergency Medicine

## 2018-07-04 DIAGNOSIS — Z13220 Encounter for screening for lipoid disorders: Secondary | ICD-10-CM | POA: Diagnosis not present

## 2018-07-04 DIAGNOSIS — Z1329 Encounter for screening for other suspected endocrine disorder: Secondary | ICD-10-CM

## 2018-07-04 DIAGNOSIS — Z113 Encounter for screening for infections with a predominantly sexual mode of transmission: Secondary | ICD-10-CM

## 2018-07-04 DIAGNOSIS — Z0000 Encounter for general adult medical examination without abnormal findings: Secondary | ICD-10-CM | POA: Diagnosis not present

## 2018-07-04 LAB — POCT URINALYSIS DIP (MANUAL ENTRY)
BILIRUBIN UA: NEGATIVE
BILIRUBIN UA: NEGATIVE mg/dL
GLUCOSE UA: NEGATIVE mg/dL
Leukocytes, UA: NEGATIVE
NITRITE UA: NEGATIVE
Protein Ur, POC: NEGATIVE mg/dL
Spec Grav, UA: 1.01 (ref 1.010–1.025)
UROBILINOGEN UA: 0.2 U/dL
pH, UA: 5.5 (ref 5.0–8.0)

## 2018-07-05 ENCOUNTER — Telehealth (INDEPENDENT_AMBULATORY_CARE_PROVIDER_SITE_OTHER): Payer: BLUE CROSS/BLUE SHIELD | Admitting: Emergency Medicine

## 2018-07-05 ENCOUNTER — Encounter: Payer: Self-pay | Admitting: Radiology

## 2018-07-05 ENCOUNTER — Other Ambulatory Visit: Payer: Self-pay

## 2018-07-05 DIAGNOSIS — R5383 Other fatigue: Secondary | ICD-10-CM | POA: Diagnosis not present

## 2018-07-05 LAB — CBC WITH DIFFERENTIAL/PLATELET
BASOS: 0 %
Basophils Absolute: 0 10*3/uL (ref 0.0–0.2)
EOS (ABSOLUTE): 0.1 10*3/uL (ref 0.0–0.4)
EOS: 1 %
Hematocrit: 39.4 % (ref 34.0–46.6)
Hemoglobin: 13 g/dL (ref 11.1–15.9)
Immature Grans (Abs): 0 10*3/uL (ref 0.0–0.1)
Immature Granulocytes: 0 %
Lymphocytes Absolute: 2.8 10*3/uL (ref 0.7–3.1)
Lymphs: 35 %
MCH: 28.8 pg (ref 26.6–33.0)
MCHC: 33 g/dL (ref 31.5–35.7)
MCV: 87 fL (ref 79–97)
MONOCYTES: 10 %
MONOS ABS: 0.8 10*3/uL (ref 0.1–0.9)
NEUTROS ABS: 4.3 10*3/uL (ref 1.4–7.0)
NEUTROS PCT: 54 %
Platelets: 351 10*3/uL (ref 150–450)
RBC: 4.52 x10E6/uL (ref 3.77–5.28)
RDW: 12.2 % (ref 11.7–15.4)
WBC: 8 10*3/uL (ref 3.4–10.8)

## 2018-07-05 LAB — COMPREHENSIVE METABOLIC PANEL
A/G RATIO: 1.8 (ref 1.2–2.2)
ALT: 13 IU/L (ref 0–32)
AST: 11 IU/L (ref 0–40)
Albumin: 4.6 g/dL (ref 3.9–5.0)
Alkaline Phosphatase: 62 IU/L (ref 39–117)
BILIRUBIN TOTAL: 0.3 mg/dL (ref 0.0–1.2)
BUN/Creatinine Ratio: 24 — ABNORMAL HIGH (ref 9–23)
BUN: 18 mg/dL (ref 6–20)
CHLORIDE: 99 mmol/L (ref 96–106)
CO2: 22 mmol/L (ref 20–29)
Calcium: 9.5 mg/dL (ref 8.7–10.2)
Creatinine, Ser: 0.74 mg/dL (ref 0.57–1.00)
GFR calc non Af Amer: 114 mL/min/{1.73_m2} (ref >59)
GFR, EST AFRICAN AMERICAN: 131 mL/min/{1.73_m2} (ref >59)
Globulin, Total: 2.6 g/dL (ref 1.5–4.5)
Glucose: 72 mg/dL (ref 65–99)
POTASSIUM: 4.7 mmol/L (ref 3.5–5.2)
Sodium: 138 mmol/L (ref 134–144)
Total Protein: 7.2 g/dL (ref 6.0–8.5)

## 2018-07-05 LAB — GC/CHLAMYDIA PROBE AMP (~~LOC~~) NOT AT ARMC
CHLAMYDIA, DNA PROBE: NEGATIVE
NEISSERIA GONORRHEA: NEGATIVE

## 2018-07-05 LAB — TSH: TSH: 1.57 u[IU]/mL (ref 0.450–4.500)

## 2018-07-05 LAB — LIPID PANEL
Chol/HDL Ratio: 4 ratio (ref 0.0–4.4)
Cholesterol, Total: 159 mg/dL (ref 100–199)
HDL: 40 mg/dL (ref >39)
LDL Calculated: 103 mg/dL — ABNORMAL HIGH (ref 0–99)
TRIGLYCERIDES: 79 mg/dL (ref 0–149)
VLDL Cholesterol Cal: 16 mg/dL (ref 5–40)

## 2018-07-05 NOTE — Progress Notes (Signed)
Telemedicine Encounter- SOAP NOTE Established Patient  This telephone encounter was conducted with the patient's (or proxy's) verbal consent via audio telecommunications: yes/no: Yes Patient was instructed to have this encounter in a suitably private space; and to only have persons present to whom they give permission to participate. In addition, patient identity was confirmed by use of name plus two identifiers (DOB and address).  I discussed the limitations, risks, security and privacy concerns of performing an evaluation and management service by telephone and the availability of in person appointments. I also discussed with the patient that there may be a patient responsible charge related to this service. The patient expressed understanding and agreed to proceed.  I spent a total of TIME; 0 MIN TO 60 MIN: 15 minutes talking with the patient or their proxy.  No chief complaint on file. Feeling fatigued all the time.  Subjective   [name] [name] is a 23 y.o. female established patient. Telephone visit today for evaluation of fatigue that has been going on for the past several months.  Healthy female with no chronic medical history.  Concerned she may have sleep apnea.  Wakes up tired with a headache and has daytime sleepiness.  Drinks plenty of fluids during the day.  Eats well.  Picked up smoking recently to stay awake.  Denies EtOH abuse.  Married with a son.  Denies any other significant symptoms. Blood work done yesterday unremarkable with acceptable values.  Results discussed with patient.  HPI   Patient Active Problem List   Diagnosis Date Noted  . Cholelithiasis with chronic cholecystitis 06/21/2017  . Cough 06/01/2017  . Generalized body aches 06/01/2017  . Sinus congestion 06/01/2017  . Viral respiratory illness 06/01/2017  . Acute blood loss anemia 02/14/2017  . Rubella non-immune status, antepartum 02/14/2017  . Cesarean delivery delivered / Indication: vasa previa 11/9  02/12/2017  . Postpartum care following cesarean delivery (11/9) 02/12/2017  . Vasa previa 01/13/2017    Past Medical History:  Diagnosis Date  . Gallstones   . Medical history non-contributory     Current Outpatient Medications  Medication Sig Dispense Refill  . Cyanocobalamin (VITAMIN B 12 PO) Take 1,500 mcg by mouth daily.    . phentermine 30 MG capsule Take 30 mg by mouth every morning.    . ferrous sulfate 325 (65 FE) MG tablet Take 1 tablet (325 mg total) daily by mouth. (Patient not taking: Reported on 06/01/2017) 30 tablet 3  . fluticasone (FLONASE) 50 MCG/ACT nasal spray Place 1 spray into both nostrils daily. (Patient not taking: Reported on 07/05/2018) 16 g 0  . ibuprofen (ADVIL,MOTRIN) 600 MG tablet Take 1 tablet (600 mg total) every 6 (six) hours by mouth. (Patient not taking: Reported on 07/05/2018) 30 tablet 0  . oxyCODONE-acetaminophen (PERCOCET/ROXICET) 5-325 MG tablet Take 1 tablet every 4 (four) hours as needed by mouth (pain scale 4-7). (Patient not taking: Reported on 07/05/2018) 30 tablet 0  . triamcinolone (NASACORT) 55 MCG/ACT AERO nasal inhaler Place 2 sprays into the nose daily. (Patient not taking: Reported on 07/05/2018) 1 Inhaler 12   No current facility-administered medications for this visit.     No Known Allergies  Social History   Socioeconomic History  . Marital status: Married    Spouse name: Not on file  . Number of children: Not on file  . Years of education: Not on file  . Highest education level: Not on file  Occupational History  . Not on file  Social  Needs  . Financial resource strain: Not on file  . Food insecurity:    Worry: Not on file    Inability: Not on file  . Transportation needs:    Medical: Not on file    Non-medical: Not on file  Tobacco Use  . Smoking status: Former Smoker    Packs/day: 0.10    Years: 3.00    Pack years: 0.30    Types: Cigarettes  . Smokeless tobacco: Never Used  . Tobacco comment: quit 5 years ago   Substance and Sexual Activity  . Alcohol use: Yes  . Drug use: No  . Sexual activity: Yes    Birth control/protection: None  Lifestyle  . Physical activity:    Days per week: Not on file    Minutes per session: Not on file  . Stress: Not on file  Relationships  . Social connections:    Talks on phone: Not on file    Gets together: Not on file    Attends religious service: Not on file    Active member of club or organization: Not on file    Attends meetings of clubs or organizations: Not on file    Relationship status: Not on file  . Intimate partner violence:    Fear of current or ex partner: Not on file    Emotionally abused: Not on file    Physically abused: Not on file    Forced sexual activity: Not on file  Other Topics Concern  . Not on file  Social History Narrative  . Not on file    Review of Systems  Constitutional: Positive for malaise/fatigue. Negative for fever.  HENT: Negative for congestion and nosebleeds.   Eyes: Negative for discharge and redness.  Respiratory: Negative.  Negative for cough and shortness of breath.   Cardiovascular: Negative for chest pain and palpitations.  Gastrointestinal: Negative.  Negative for abdominal pain, diarrhea, nausea and vomiting.  Genitourinary: Negative.  Negative for dysuria.  Skin: Negative for rash.  Neurological: Negative for dizziness and headaches.  Endo/Heme/Allergies: Negative.   All other systems reviewed and are negative.   Objective   Vitals as reported by the patient: None available There were no vitals filed for this visit.  There are no diagnoses linked to this encounter.  Diagnoses and all orders for this visit:  Fatigue, unspecified type  Differential diagnosis discussed with patient.  Will address further during OV in the next 3 to 6 months.   I discussed the assessment and treatment plan with the patient. The patient was provided an opportunity to ask questions and all were answered. The  patient agreed with the plan and demonstrated an understanding of the instructions.   The patient was advised to call back or seek an in-person evaluation if the symptoms worsen or if the condition fails to improve as anticipated.  I provided 15 minutes of non-face-to-face time during this encounter.  Georgina Quint, MD  [facility]

## 2018-08-04 ENCOUNTER — Encounter: Payer: Self-pay | Admitting: Family Medicine

## 2018-08-04 ENCOUNTER — Other Ambulatory Visit: Payer: Self-pay

## 2018-08-04 ENCOUNTER — Ambulatory Visit (INDEPENDENT_AMBULATORY_CARE_PROVIDER_SITE_OTHER): Payer: BLUE CROSS/BLUE SHIELD | Admitting: Family Medicine

## 2018-08-04 VITALS — BP 122/82 | HR 79 | Temp 98.5°F | Resp 14 | Ht 64.0 in | Wt 178.0 lb

## 2018-08-04 DIAGNOSIS — M25561 Pain in right knee: Secondary | ICD-10-CM | POA: Diagnosis not present

## 2018-08-04 DIAGNOSIS — E538 Deficiency of other specified B group vitamins: Secondary | ICD-10-CM | POA: Diagnosis not present

## 2018-08-04 DIAGNOSIS — R030 Elevated blood-pressure reading, without diagnosis of hypertension: Secondary | ICD-10-CM | POA: Diagnosis not present

## 2018-08-04 DIAGNOSIS — E669 Obesity, unspecified: Secondary | ICD-10-CM

## 2018-08-04 MED ORDER — DICLOFENAC SODIUM 1.5 % TD SOLN: 1.0000 | Freq: Three times a day (TID) | TRANSDERMAL | 1 refills | Status: DC | PRN

## 2018-08-04 NOTE — Patient Instructions (Addendum)
Thank you for coming into the office today. I appreciate the opportunity to provide you with the care for your health and wellness. Today we discussed: overall health and knee pain   I have sent in some anti-inflammatory cream that she will apply up to 3 times daily as needed.  Please space out the cream 6 to 8 hours per application.  I have found it best to use it once in the morning and then once before bed.  Take 1000 mg of Tylenol at one time, you can do this 2 times daily knock some of the pain out.  Taking it at nighttime is very beneficial.  And taking it before work is as well.  Please practice the rice that we discussed in the office.  I have attached some information on them.  I have also attached some knee exercises that you can do to help strengthen your knee and the muscles around them.  Please do them as tolerated.  Over time you will get stronger and better at doing them and it will not be as painful.  Please listen to your body if you are in pain do not push yourself into the exercise.  We will see you back in 2 weeks if you are not doing better.  WASH YOUR HANDS WELL AND FREQUENTLY. AVOID TOUCHING YOUR FACE, UNLESS YOUR HANDS ARE FRESHLY WASHED.  GET FRESH AIR DAILY. STAY HYDRATED WITH WATER.   It was a pleasure to see you and I look forward to continuing to work together on your health and well-being. Please do not hesitate to call the office if you need care or have questions about your care.  Have a wonderful day and week. With Gratitude, Tereasa CoopHannah Reesa Gotschall, DNP, AGNP-BC   RICE Therapy for Routine Care of Injuries Many injuries can be cared for with rest, ice, compression, and elevation (RICE therapy). This includes:  Resting the injured part.  Putting ice on the injury.  Putting pressure (compression) on the injury.  Raising the injured part (elevation). Using RICE therapy can help to lessen pain and swelling. Supplies needed:  Ice.  Plastic bag.  Towel.   Elastic bandage.  Pillow or pillows to raise (elevate) your injured body part. How to care for your injury with RICE therapy Rest Limit your normal activities, and try not to use the injured part of your body. You can go back to your normal activities when your doctor says it is okay to do them and you feel okay. Ask your doctor if you should do exercises to help your injury get better. Ice Put ice on the injured area. Do not put ice on your bare skin.  Put ice in a plastic bag.  Place a towel between your skin and the bag.  Leave the ice on for 20 minutes, 2-3 times a day. Use ice on as many days as told by your doctor.  Compression Compression means putting pressure on the injured area. This can be done with an elastic bandage. If an elastic bandage has been put on your injury:  Do not wrap the bandage too tight. Wrap the bandage more loosely if part of your body away from the bandage is blue, swollen, cold, painful, or loses feeling (gets numb).  Take off the bandage and put it on again. Do this every 3-4 hours or as told by your doctor.  See your doctor if the bandage seems to make your problems worse.  Elevation Elevation means keeping the  injured area raised. If you can, raise the injured area above your heart or the center of your chest. Contact a doctor if:  You keep having pain and swelling.  Your symptoms get worse. Get help right away if:  You have sudden bad pain at your injury or lower than your injury.  You have redness or more swelling around your injury.  You have tingling or numbness at your injury or lower than your injury, and it does not go away when you take off the bandage. Summary  Many injuries can be cared for using rest, ice, compression, and elevation (RICE therapy).  You can go back to your normal activities when you feel okay and your doctor says it is okay.  Put ice on the injured area as told by your doctor.  Get help if your symptoms  get worse or if you keep having pain and swelling. This information is not intended to replace advice given to you by your health care provider. Make sure you discuss any questions you have with your health care provider. Document Released: 09/08/2007 Document Revised: 12/10/2016 Document Reviewed: 12/10/2016 Elsevier Interactive Patient Education  2019 [employer]   Knee Exercises Ask your health care provider which exercises are safe for you. Do exercises exactly as told by your health care provider and adjust them as directed. It is normal to feel mild stretching, pulling, tightness, or discomfort as you do these exercises, but you should stop right away if you feel sudden pain or your pain gets worse.Do not begin these exercises until told by your health care provider. STRETCHING AND RANGE OF MOTION EXERCISES These exercises warm up your muscles and joints and improve the movement and flexibility of your knee. These exercises also help to relieve pain, numbness, and tingling. Exercise A: Knee Extension, Prone 1. Lie on your abdomen on a bed. 2. Place your left / right knee just beyond the edge of the surface so your knee is not on the bed. You can put a towel under your left / right thigh just above your knee for comfort. 3. Relax your leg muscles and allow gravity to straighten your knee. You should feel a stretch behind your left / right knee. 4. Hold this position for __10-20________ seconds. 5. Scoot up so your knee is supported between repetitions. Repeat __3-7________ times. Complete this stretch ____2______ times a day. Exercise B: Knee Flexion, Active  1. Lie on your back with both knees straight. If this causes back discomfort, bend your left / right knee so your foot is flat on the floor. 2. Slowly slide your left / right heel back toward your buttocks until you feel a gentle stretch in the front of your knee or thigh. 3. Hold this position for ___10-20_______ seconds. 4.  Slowly slide your left / right heel back to the starting position. Repeat ____3-7______ times. Complete this exercise ___2_______ times a day. Exercise C: Quadriceps, Prone  1. Lie on your abdomen on a firm surface, such as a bed or padded floor. 2. Bend your left / right knee and hold your ankle. If you cannot reach your ankle or pant leg, loop a belt around your foot and grab the belt instead. 3. Gently pull your heel toward your buttocks. Your knee should not slide out to the side. You should feel a stretch in the front of your thigh and knee. 4. Hold this position for _____10-20_____ seconds. Repeat __3-7________ times. Complete this stretch ___2_______ times a day. Exercise  D: Hamstring, Supine 1. Lie on your back. 2. Loop a belt or towel over the ball of your left / right foot. The ball of your foot is on the walking surface, right under your toes. 3. Straighten your left / right knee and slowly pull on the belt to raise your leg until you feel a gentle stretch behind your knee. ? Do not let your left / right knee bend while you do this. ? Keep your other leg flat on the floor. 4. Hold this position for __10-20________ seconds. Repeat _3-7_________ times. Complete this stretch ____2______ times a day. STRENGTHENING EXERCISES These exercises build strength and endurance in your knee. Endurance is the ability to use your muscles for a long time, even after they get tired. Exercise E: Quadriceps, Isometric  1. Lie on your back with your left / right leg extended and your other knee bent. Put a rolled towel or small pillow under your knee if told by your health care provider. 2. Slowly tense the muscles in the front of your left / right thigh. You should see your kneecap slide up toward your hip or see increased dimpling just above the knee. This motion will push the back of the knee toward the floor. 3. For __10________ seconds, keep the muscle as tight as you can without increasing your  pain. 4. Relax the muscles slowly and completely. Repeat ____3-7______ times. Complete this exercise __2________ times a day. Exercise F: Straight Leg Raises - Quadriceps 1. Lie on your back with your left / right leg extended and your other knee bent. 2. Tense the muscles in the front of your left / right thigh. You should see your kneecap slide up or see increased dimpling just above the knee. Your thigh may even shake a bit. 3. Keep these muscles tight as you raise your leg 4-6 inches (10-15 cm) off the floor. Do not let your knee bend. 4. Hold this position for _10-20_________ seconds. 5. Keep these muscles tense as you lower your leg. 6. Relax your muscles slowly and completely after each repetition. Repeat ______3-7____ times. Complete this exercise ______2____ times a day. Exercise G: Hamstring, Isometric 1. Lie on your back on a firm surface. 2. Bend your left / right knee approximately ___45-70_______ degrees. 3. Dig your left / right heel into the surface as if you are trying to pull it toward your buttocks. Tighten the muscles in the back of your thighs to dig as hard as you can without increasing any pain. 4. Hold this position for _10-20_________ seconds. 5. Release the tension gradually and allow your muscles to relax completely for _15_________ seconds after each repetition. Repeat __3-7________ times. Complete this exercise ___2_______ times a day. Exercise H: Hamstring Curls  If told by your health care provider, do this exercise while wearing ankle weights. Begin with __0________ weights.  Do not wear ankle weights that are more than __________. 1. Lie on your abdomen with your legs straight. 2. Bend your left / right knee as far as you can without feeling pain. Keep your hips flat against the floor. 3. Hold this position for _10-20_________ seconds. 4. Slowly lower your leg to the starting position.  Repeat ___3-7_______ times. Complete this exercise _2_________ times a  day. Exercise I: Squats (Quadriceps) 1. Stand in front of a table, with your feet and knees pointing straight ahead. You may rest your hands on the table for balance but not for support. 2. Slowly bend your knees and lower your  hips like you are going to sit in a chair. ? Keep your weight over your heels, not over your toes. ? Keep your lower legs upright so they are parallel with the table legs. ? Do not let your hips go lower than your knees. ? Do not bend lower than told by your health care provider. ? If your knee pain increases, do not bend as low. 3. Hold the squat position for __10-30________ seconds. 4. Slowly push with your legs to return to standing. Do not use your hands to pull yourself to standing. Repeat __5________ times. Complete this exercise _____2_____ times a day. Exercise J: Wall Slides (Quadriceps)  1. Lean your back against a smooth wall or door while you walk your feet out 18-24 inches (46-61 cm) from it. 2. Place your feet hip-width apart. 3. Slowly slide down the wall or door until your knees bend ____45-90______ degrees. Keep your knees over your heels, not over your toes. Keep your knees in line with your hips. 4. Hold for _15-30_________ seconds. Repeat ______5____ times. Complete this exercise ____2______ times a day. Exercise K: Straight Leg Raises - Hip Abductors 1. Lie on your side with your left / right leg in the top position. Lie so your head, shoulder, knee, and hip line up. You may bend your bottom knee to help you keep your balance. 2. Roll your hips slightly forward so your hips are stacked directly over each other and your left / right knee is facing forward. 3. Leading with your heel, lift your top leg 4-6 inches (10-15 cm). You should feel the muscles in your outer hip lifting. ? Do not let your foot drift forward. ? Do not let your knee roll toward the ceiling. 4. Hold this position for __20________ seconds. 5. Slowly return your leg to the  starting position. 6. Let your muscles relax completely after each repetition. Repeat ____5______ times. Complete this exercise _______2___ times a day. Exercise L: Straight Leg Raises - Hip Extensors 1. Lie on your abdomen on a firm surface. You can put a pillow under your hips if that is more comfortable. 2. Tense the muscles in your buttocks and lift your left / right leg about 4-6 inches (10-15 cm). Keep your knee straight as you lift your leg. 3. Hold this position for ___5-10_______ seconds. 4. Slowly lower your leg to the starting position. 5. Let your leg relax completely after each repetition. Repeat ___5_______ times. Complete this exercise __2________ times a day. This information is not intended to replace advice given to you by your health care provider. Make sure you discuss any questions you have with your health care provider. Document Released: 02/03/2005 Document Revised: 12/15/2015 Document Reviewed: 01/26/2015 Elsevier Interactive Patient Education  2018 [employer].

## 2018-08-04 NOTE — Progress Notes (Signed)
New Patient Office Visit  Subjective:  Patient ID: [name] [name], female    DOB: [birth year]  Age: 23 y.o. MRN: [id number]  CC:  Chief Complaint  Patient presents with  . New Patient (Initial Visit)    establish care    HPI [name] [name] presents for establish care.  23 year old female patient who was originally seen at [place] office.  Has now started working closer to home and would like to go to an office that is closer to her home.  Lives in [place].  Presents today doing well overall.  Just to establish care but does have concern for right knee discomfort and pain that is ongoing.  She also discussed that she is going to be getting a sleep study soon because she has had excessive fatigue.  But the sleep study was canceled secondary to having the pandemic.  Additionally is followed by a weight clinic in [place].  Who has her on phentermine at this time.  New onset of knee pain since April 24, 1 week. Reports that she had a slip/pivot fall in the shower.  Reports feeling some slight popping sensation.  And that was when the discomfort in her knee occurred.This pain is located in the right knee, no radiation down into the leg or up into the thigh.  Reports that it is hard to drive like push the pedals.  Reports using it all day makes it hurt more at night.  Pivoting or turning on the knee when it is planted as well causes pain.  She is not find anything to relieve it.  Reports hurts more at night after using it all day.Pain score today in office is rated 4/10. At its worse the pain is a level 7-8/10.  Reports that she is taken up to 600 mg of ibuprofen without much relief.  Reports that she is tried to elevate it but that causes more discomfort.  She has full range of motion but has discomfort with the range of motion.  She endorses getting some cramps but this is her usual baseline.  Overall is doing well today in the office.  Reports that she is been taking better care of herself.  Reports  that she is doing the weight loss clinic to help with weight management.  And has had some improvement with this.  Outside of her knee discomfort she is doing good and has no other complaints.  She has no significant family history to report.  Is a former smoker but not smoking at this time.  And is using alcohol but occasionally.  She lives with her husband dealing, and their son all of her who will be 2 in November.  They have 2 dogs as well at home.  She drinks about 1 cup of coffee a day.  But does not rely on caffeine.  Reports that she is been increased drinking her water and doing well with this.  Reports that she eats all major food groups, and does not consume a lot of red meat.  Working on eating healthy and exercising daily.  Reports that she wears her seatbelt, wears sunscreen as needed.  And has smoke detectors with batteries that are current in them.  Past Medical, Surgical, Social History, Allergies, and Medications have been Reviewed.   Past Medical History:  Diagnosis Date  . Gallstones   . Medical history non-contributory     Past Surgical History:  Procedure Laterality Date  . CESAREAN SECTION N/A 02/11/2017  Procedure: Primary CESAREAN SECTION;  Surgeon: Olivia Mackie, MD;  Location: [facility] BIRTHING SUITES;  Service: Obstetrics;  Laterality: N/A;  EDD: 03/20/17  . CHOLECYSTECTOMY N/A 06/21/2017   Procedure: LAPAROSCOPIC CHOLECYSTECTOMY WITH INTRAOPERATIVE CHOLANGIOGRAM;  Surgeon: Darnell Level, MD;  Location: [facility] ORS;  Service: General;  Laterality: N/A;  . NO PAST SURGERIES      Family History  Problem Relation Age of Onset  . Heart disease Mother   . Hypertension Mother 19       PULMONARY ARTERIAL HYPERTENSION  . Mental retardation Maternal Uncle   . Hypertension Father   . Healthy Sister   . Healthy Brother   . Healthy Sister   . Healthy Sister   . Healthy Brother   . Healthy Brother   . Healthy Brother     Social History   Socioeconomic History  . Marital  status: Married    Spouse name: Ames Coupe  . Number of children: 1  . Years of education: 76  . Highest education level: 12th grade  Occupational History    Comment: [employer]   Social Needs  . Financial resource strain: Not hard at all  . Food insecurity:    Worry: Never true    Inability: Never true  . Transportation needs:    Medical: No    Non-medical: No  Tobacco Use  . Smoking status: Former Smoker    Packs/day: 0.10    Years: 3.00    Pack years: 0.30    Types: Cigarettes  . Smokeless tobacco: Never Used  . Tobacco comment: quit 5 years ago  Substance and Sexual Activity  . Alcohol use: Yes    Comment: occasional  . Drug use: No  . Sexual activity: Yes    Birth control/protection: None  Lifestyle  . Physical activity:    Days per week: 7 days    Minutes per session: Not on file  . Stress: Very much  Relationships  . Social connections:    Talks on phone: More than three times a week    Gets together: More than three times a week    Attends religious service: More than 4 times per year    Active member of club or organization: No    Attends meetings of clubs or organizations: Never    Relationship status: Married  . Intimate partner violence:    Fear of current or ex partner: No    Emotionally abused: No    Physically abused: No    Forced sexual activity: No  Other Topics Concern  . Not on file  Social History Narrative   Lives with husband Dillion    And Joelene Millin 2 in Nov   Two dogs: Coop and [place]      Caffeine: 1 coffee a day   Drinks [employer] all foods groups: not a lot of red meat   Working to eat healthy and exercise daily.      Wears seatbelt, sunscreen      Smoke detectors good batteries           ROS Review of Systems  Constitutional: Negative for chills and fever.  HENT: Negative.  Negative for congestion and sinus pressure.   Eyes: Negative.   Respiratory: Negative.  Negative for cough, shortness of breath and  wheezing.   Cardiovascular: Negative.  Negative for chest pain.  Gastrointestinal: Negative.   Endocrine: Negative.   Genitourinary: Negative.   Musculoskeletal:       See HPI  Skin: Negative.   Allergic/Immunologic: Negative.   Neurological: Negative.   Hematological: Negative.   Psychiatric/Behavioral: Negative.   All other systems reviewed and are negative.   Objective:   Today's Vitals: BP 122/82   Pulse 79   Temp 98.5 F (36.9 C) (Oral)   Resp 14   Ht 5\' 4"  (1.626 m)   Wt 178 lb (80.7 kg)   LMP 07/25/2018 (Exact Date)   SpO2 99%   Breastfeeding No   BMI 30.55 kg/m   Physical Exam Vitals signs reviewed.  Constitutional:      Appearance: Normal appearance. She is obese.  HENT:     Head: Normocephalic and atraumatic.     Right Ear: External ear normal.     Left Ear: External ear normal.     Nose: Nose normal.  Eyes:     General: No scleral icterus.       Right eye: No discharge.        Left eye: No discharge.     Conjunctiva/sclera: Conjunctivae normal.  Neck:     Musculoskeletal: Normal range of motion.  Cardiovascular:     Rate and Rhythm: Normal rate and regular rhythm.     Pulses: Normal pulses.     Heart sounds: Normal heart sounds.  Pulmonary:     Effort: Pulmonary effort is normal.     Breath sounds: Normal breath sounds.  Abdominal:     General: Bowel sounds are normal.  Musculoskeletal: Normal range of motion.     Right knee: She exhibits swelling and bony tenderness. She exhibits normal range of motion, no effusion, no deformity, no erythema, normal alignment, no LCL laxity, normal patellar mobility, normal meniscus and no MCL laxity. Tenderness found. Medial joint line and lateral joint line tenderness noted.     Comments: Generalized diffuse discomfort and pain of the right knee.  Full range of motion intact.  There is tenderness with palpitation of the medial joint line and lateral joint line.  Palpitation tenderness over the patella femoral  area. No crepitus at this time.  Mild swelling on the medial side upper knee.  Skin:    General: Skin is warm and dry.  Neurological:     Mental Status: She is alert and oriented to person, place, and time.  Psychiatric:        Mood and Affect: Mood normal.        Behavior: Behavior normal.        Thought Content: Thought content normal.        Judgment: Judgment normal.     Assessment & Plan:  1. Obesity (BMI 30.0-34.9) Improved, reports that she is taking the phentermine and being seen by the weight loss clinic in [place].  Reports that she will be continuing to see them and they will be managing her phentermine.  Reports that she has lost some weight and continues to be on the phentermine in the interim to help with her weight management.  She has also been trying to exercise and get more heart health as well as blood pressure lower.  She is also been eating better as well.  Advised to continue seeing the weight loss clinic in [place].  If she needs any help for her weight loss journey we are here for her.  2. Acute pain of right knee New acute pain in the right knee.  Possibly secondary to new implementation of exercise, as she is doing a lot of pivoting and bending and twisting moves.  She also had a slight injury in the shower which could have contributed to the discomfort.  Advised to treat this conservatively and provided education today in the office for her to do RICE and knee strengthening exercises. Provided with handouts on AVS. will encourage this for the next several weeks.  If she sees no improvement could consider physical therapy, x-ray, referral.  Have prescribed some topical diclofenac to see if that helps target the area of discomfort.  She is educated on the use of this.  Reviewed side effects, risks and benefits of medication.  We will follow-up with her in a couple weeks to see how she is doing.  Advised that if she does not need the appointment she can cancel  it.  Patient acknowledged agreement and understanding of the plan.    - Diclofenac Sodium 1.5 % SOLN; Place 150 mLs onto the skin 3 (three) times daily as needed for up to 14 days.  Dispense: 1 Bottle; Refill: 1  3. Vitamin B 12 deficiency Continue current medication regime.  We will look to check a B12 in the coming future.  Reports that she is on this from the weight loss clinic.  Advised that they continue to manage this at this time.  If they need [place] to do anything we are happy to collaborate in her care.  4. Prehypertension Today she is prehypertensive 122/82.  Educated on the prehypertensive status that she is in.  And how to implement changes that she is doing currently for weight loss that can help her with this, such as reduction in sodium.  Further educated her on the use of phentermine and her blood pressure as phentermine can elevate BP.  Advised that she not use phentermine in the longer term secondary to raising her blood pressure.  Reports that she took her medicine prior to coming into the office and that might be why she has an elevated BP today.  Overall she denies any trouble or signs or symptoms of elevated BP in her daily life.   Problem List Items Addressed This Visit    None    Visit Diagnoses    Vitamin B 12 deficiency    -  Primary   Acute pain of right knee       Relevant Medications   Diclofenac Sodium 1.5 % SOLN   Obesity (BMI 30.0-34.9)       Prehypertension          Outpatient Encounter Medications as of 08/04/2018  Medication Sig  . Cyanocobalamin (VITAMIN B 12 PO) Take 1,500 mcg by mouth daily.  . phentermine 30 MG capsule Take 30 mg by mouth every morning.  . Diclofenac Sodium 1.5 % SOLN Place 150 mLs onto the skin 3 (three) times daily as needed for up to 14 days.  . [DISCONTINUED] ferrous sulfate 325 (65 FE) MG tablet Take 1 tablet (325 mg total) daily by mouth. (Patient not taking: Reported on 06/01/2017)  . [DISCONTINUED] fluticasone (FLONASE) 50  MCG/ACT nasal spray Place 1 spray into both nostrils daily. (Patient not taking: Reported on 07/05/2018)  . [DISCONTINUED] ibuprofen (ADVIL,MOTRIN) 600 MG tablet Take 1 tablet (600 mg total) every 6 (six) hours by mouth. (Patient not taking: Reported on 07/05/2018)  . [DISCONTINUED] oxyCODONE-acetaminophen (PERCOCET/ROXICET) 5-325 MG tablet Take 1 tablet every 4 (four) hours as needed by mouth (pain scale 4-7). (Patient not taking: Reported on 07/05/2018)  . [DISCONTINUED] triamcinolone (NASACORT) 55 MCG/ACT AERO nasal inhaler Place 2 sprays into the nose daily. (  Patient not taking: Reported on 07/05/2018)   No facility-administered encounter medications on file as of 08/04/2018.     Follow-up: Return in about 2 weeks (around 08/18/2018) for knee pain f/u .   Freddy Finner, NP

## 2018-08-08 ENCOUNTER — Encounter: Payer: Self-pay | Admitting: Family Medicine

## 2018-08-09 ENCOUNTER — Other Ambulatory Visit: Payer: Self-pay

## 2018-08-09 ENCOUNTER — Other Ambulatory Visit: Payer: Self-pay | Admitting: Family Medicine

## 2018-08-09 DIAGNOSIS — M25561 Pain in right knee: Secondary | ICD-10-CM

## 2018-08-09 MED ORDER — DICLOFENAC SODIUM 1.5 % TD SOLN: TRANSDERMAL | 1 refills | Status: DC

## 2018-08-10 ENCOUNTER — Telehealth: Payer: Self-pay

## 2018-08-10 DIAGNOSIS — M25561 Pain in right knee: Secondary | ICD-10-CM

## 2018-08-10 NOTE — Telephone Encounter (Signed)
Patient called in stating leg is tingling from the knee down and she is unable to straighten the right leg at all. It almost feels like there is fluid behind the knee cap. Has been unable to straighten leg this entire week. Patient states she has been wearing a brace to try and see if that would help it but it hasn't helped. The diclofenac drops aren't helping with the pain either. 7/10 but she is more uncomfortable than anything. Please advise.

## 2018-08-10 NOTE — Telephone Encounter (Signed)
Spoke with patient and advised her to take Ibuprofen 800mg  every 8 hours but not to take it with the diclofenac. Advised her I would place an urgent referral for ortho and see if we could get her in to be seen tomorrow. She is agreeable with treatment plan

## 2018-08-10 NOTE — Telephone Encounter (Signed)
Urgent referral place for sports medicine

## 2018-08-11 ENCOUNTER — Ambulatory Visit (INDEPENDENT_AMBULATORY_CARE_PROVIDER_SITE_OTHER): Payer: BLUE CROSS/BLUE SHIELD | Admitting: Orthopedic Surgery

## 2018-08-11 ENCOUNTER — Ambulatory Visit (INDEPENDENT_AMBULATORY_CARE_PROVIDER_SITE_OTHER): Payer: BLUE CROSS/BLUE SHIELD

## 2018-08-11 ENCOUNTER — Encounter: Payer: Self-pay | Admitting: Orthopedic Surgery

## 2018-08-11 ENCOUNTER — Other Ambulatory Visit: Payer: Self-pay

## 2018-08-11 VITALS — BP 117/80 | HR 90 | Temp 96.7°F | Ht 64.0 in | Wt 177.0 lb

## 2018-08-11 DIAGNOSIS — M25561 Pain in right knee: Secondary | ICD-10-CM

## 2018-08-11 DIAGNOSIS — M23321 Other meniscus derangements, posterior horn of medial meniscus, right knee: Secondary | ICD-10-CM

## 2018-08-11 NOTE — Patient Instructions (Addendum)
OOW  April 21 return June 22  Meniscus Injury, Arthroscopy   Arthroscopy is a surgical procedure that involves the use of a small scope that has a camera and surgical instruments on the end (arthroscope). An arthroscope can be used to repair your meniscus injury.  LET [facility] CARE PROVIDER KNOW ABOUT:  Any allergies you have.  All medicines you are taking, including vitamins, herbs, eyedrops, creams, and over-the-counter medicines.  Any recent colds or infections you have had or currently have.  Previous problems you or members of your family have had with the use of anesthetics.  Any blood disorders or blood clotting problems you have.  Previous surgeries you have had.  Medical conditions you have. RISKS AND COMPLICATIONS Generally, this is a safe procedure. However, as with any procedure, problems can occur. Possible problems include:  Damage to nerves or blood vessels.  Excess bleeding.  Blood clots.  Infection. BEFORE THE PROCEDURE  Do not eat or drink for 6-8 hours before the procedure.  Take medicines as directed by your surgeon. Ask your surgeon about changing or stopping your regular medicines.  You may have lab tests the morning of surgery. PROCEDURE  You will be given one of the following:   A medicine that numbs the area (local anesthesia).  A medicine that makes you go to sleep (general anesthesia).  A medicine injected into your spine that numbs your body below the waist (spinal anesthesia). Most often, several small cuts (incisions) are made in the knee. The arthroscope and instruments go into the incisions to repair the damage. The torn portion of the meniscus is removed.   AFTER THE PROCEDURE  You will be taken to the recovery area where your progress will be monitored. When you are awake, stable, and taking fluids without complications, you will be allowed to go home. This is usually the same day. A torn or stretched ligament (ligament sprain)  may take 6-8 weeks to heal.   It takes about the 4-6 WEEKS if your surgeon removed a torn meniscus.  A repaired meniscus may require 6-12 weeks of recovery time.  A torn ligament needing reconstructive surgery may take 6-12 months to heal fully.   This information is not intended to replace advice given to you by your health care provider. Make sure you discuss any questions you have with your health care provider. You have decided to proceed with operative arthroscopy of the knee. You have decided not to continue with nonoperative measures such as but not limited to oral medication, weight loss, activity modification, physical therapy, bracing, or injection.  We will perform operative arthroscopy of the knee. Some of the risks associated with arthroscopic surgery of the knee include but are not limited to Bleeding Infection Swelling Stiffness Blood clot Pain Need for knee replacement surgery    In compliance with recent [place] law in federal regulation regarding opioid use and abuse and addiction, we will taper (stop) opioid medication after 2 weeks.  If you're not comfortable with these risks and would like to continue with nonoperative treatment please let Dr. Romeo Apple know prior to your surgery.

## 2018-08-11 NOTE — Progress Notes (Signed)
Patient ID: [name] [name], female   DOB: December 06, 1995, 23 y.o.   MRN: [id number]  Chief Complaint  Patient presents with  . Knee Pain    Rt knee for 2 weeks NKI    Assessment and Plan:  IMAGING: I have read and interpret the xrays as follows: Well knee with effusion   Diagnosis and treatment:   Torn medial meniscus   No orders of the defined types were placed in this encounter.  Plan:  Strong clinical and history for torn medial meniscus.  Recommend arthroscopy right knee partial medial meniscectomy   Chief Complaint  Patient presents with  . Knee Pain    Rt knee for 2 weeks NKI    23 year old female very active, boxes for activity exercises frequently presents for evaluation of painful stiff right knee.  Approximately 2 weeks ago may have twisted her knee in the shower.  Initial treatment included multiple NSAIDs topical medication relative rest.  Still cannot fully straighten or fully bend the knee.  Pain 4-7/10, medial joint line right knee   Review of Systems Review of Systems  Constitutional: Negative for chills and fever.  Respiratory: Negative for cough.   Neurological: Negative for tingling.    Past Medical History:  Diagnosis Date  . Gallstones   . Medical history non-contributory     Past Surgical History:  Procedure Laterality Date  . CESAREAN SECTION N/A 02/11/2017   Procedure: Primary CESAREAN SECTION;  Surgeon: [name] Mackie, MD;  Location: [facility] BIRTHING SUITES;  Service: Obstetrics;  Laterality: N/A;  EDD: 03/20/17  . CHOLECYSTECTOMY N/A 06/21/2017   Procedure: LAPAROSCOPIC CHOLECYSTECTOMY WITH INTRAOPERATIVE CHOLANGIOGRAM;  Surgeon: [name] Level, MD;  Location: [facility] ORS;  Service: General;  Laterality: N/A;  . NO PAST SURGERIES      No Known Allergies  Current Outpatient Medications  Medication Sig Dispense Refill  . Cyanocobalamin (VITAMIN B 12 PO) Take 1,500 mcg by mouth daily.    . Diclofenac Sodium 1.5 % SOLN Apply 40 drops to knee three  times daily 1 Bottle 1  . phentermine 30 MG capsule Take 30 mg by mouth every morning.     No current facility-administered medications for this visit.      Physical Exam BP 117/80   Pulse 90   Temp (!) 96.7 F (35.9 C)   Ht 5\' 4"  (1.626 m)   Wt 177 lb (80.3 kg)   LMP 07/25/2018 (Exact Date)   BMI 30.38 kg/m     The patient is well developed well nourished and well groomed.   Orientation to person place and time is normal   Mood is pleasant. Affect normal  Ambulatory status is remarkable for a limp in the involved extremity         Right knee examination:  Inspection: Tenderness is noted over the medial joint line   ROM: Is limited by pain with a maximum flexion arc of 10-90 passive.  Stability: Collateral ligaments are stable, the Lachman test and anterior and posterior drawer tests are normal   We do palpate medial joint line tenderness and a positive McMurray's for medial meniscal tear  Gait walks with a flexed knee gait tiptoeing with a limp   Motor exam: Grade 5 motor strength in the quadriceps musculature   Skin: Warm dry and intact over the right leg                       Neuro: normal sensation   Vascular: 2+ DP pulse  with normal color and no edema.   Currently the left lower extremity and knee examination revealed no tenderness or swelling, full range of motion without contracture subluxation atrophy or tremor. Normal muscle tone no instability and the neurovascular status of the limb is normal.  Upper extremities no tenderness normal alignment normal range of motion no instability no tremors normal strength normal muscle tone skin normal  Lower extremities no lymphadenopathy.  All 4 extremities have normal pulse and perfusion with no edema    [name] [name] , MD 08/11/2018 9:24 AM

## 2018-08-11 NOTE — Addendum Note (Signed)
Addended byCaffie Damme on: 08/11/2018 09:37 AM   Modules accepted: Orders, SmartSet

## 2018-08-14 ENCOUNTER — Encounter: Payer: BLUE CROSS/BLUE SHIELD | Admitting: Emergency Medicine

## 2018-08-14 ENCOUNTER — Other Ambulatory Visit: Payer: Self-pay | Admitting: Orthopedic Surgery

## 2018-08-15 NOTE — Patient Instructions (Signed)
[name] [name]  08/15/2018     @   Your procedure is scheduled on  08/24/2018  Report to [facility] at  615   A.M.  Call this number if you have problems the morning of surgery:  [phone]   Remember:  Do not eat or drink after midnight.                         Take these medicines the morning of surgery with A SIP OF WATER  None.    Do not wear jewelry, make-up or nail polish.  Do not wear lotions, powders, or perfumes, or deodorant.  Do not shave 48 hours prior to surgery.  Men may shave face and neck.  Do not bring valuables to the hospital.  [facility] is not responsible for any belongings or valuables.  Contacts, dentures or bridgework may not be worn into surgery.  Leave your suitcase in the car.  After surgery it may be brought to your room.  For patients admitted to the hospital, discharge time will be determined by your treatment team.  Patients discharged the day of surgery will not be allowed to drive home.   Name and phone number of your driver:   family Special instructions:  STOP phentermine  Please read over the following fact sheets that you were given. Anesthesia Post-op Instructions and Care and Recovery After Surgery       Arthroscopic Knee Ligament Repair, Care After This sheet gives you information about how to care for yourself after your procedure. Your health care provider may also give you more specific instructions. If you have problems or questions, contact your health care provider. What can I expect after the procedure? After the procedure, it is common to have:  Pain in your knee.  Bruising and swelling on your knee, calf, and ankle for 3-4 days.  Fatigue. Follow these instructions at home: If you have a brace or immobilizer:  Wear the brace or immobilizer as told by your health care provider. Remove it only as told by your health care provider.  Loosen the splint or immobilizer if your toes tingle,  become numb, or turn cold and blue.  Keep the brace or immobilizer clean. Bathing  Do not take baths, swim, or use a hot tub until your health care provider approves. Ask your health care provider if you can take showers.  Keep your bandage (dressing) dry until your health care provider says that it can be removed. Cover it and your brace or immobilizer with a watertight covering when you take a shower. Incision care   Follow instructions from your health care provider about how to take care of your incision. Make sure you: ? Wash your hands with soap and water before you change your bandage (dressing). If soap and water are not available, use hand sanitizer. ? Change your dressing as told by your health care provider. ? Leave stitches (sutures), skin glue, or adhesive strips in place. These skin closures may need to stay in place for 2 weeks or longer. If adhesive strip edges start to loosen and curl up, you may trim the loose edges. Do not remove adhesive strips completely unless your health care provider tells you to do that.  Check your incision area every day for signs of infection. Check for: ? More redness, swelling, or pain. ? More fluid or blood. ? Warmth. ? Pus or a bad  smell. Managing pain, stiffness, and swelling   If directed, put ice on the affected area. ? If you have a removable brace or immobilizer, remove it as told by your health care provider. ? Put ice in a plastic bag. ? Place a towel between your skin and the bag or between your brace or immobilizer and the bag. ? Leave the ice on for 20 minutes, 2-3 times a day.  Move your toes often to avoid stiffness and to lessen swelling.  Raise (elevate) the injured area above the level of your heart while you are sitting or lying down. Driving  Do not drive until your health care provider approves. If you have a brace or immobilizer on your leg, ask your health care provider when it is safe for you to drive.  Do not  drive or use heavy machinery while taking prescription pain medicine. Activity  Rest as directed. Ask your health care provider what activities are safe for you.  Do physical therapy exercises as told by your health care provider. Physical therapy will help you regain strength and motion in your knee.  Follow instructions from your health care provider about: ? When you may start motion exercises. ? When you may start riding a stationary bike and doing other low-impact activities. ? When you may start to jog and do other high-impact activities. Safety  Do not use the injured limb to support your body weight until your health care provider says that you can. Use crutches as told by your health care provider. General instructions  Do not use any products that contain nicotine or tobacco, such as cigarettes and e-cigarettes. These can delay bone healing. If you need help quitting, ask your health care provider.  To prevent or treat constipation while you are taking prescription pain medicine, your health care provider may recommend that you: ? Drink enough fluid to keep your urine clear or pale yellow. ? Take over-the-counter or prescription medicines. ? Eat foods that are high in fiber, such as fresh fruits and vegetables, whole grains, and beans. ? Limit foods that are high in fat and processed sugars, such as fried and sweet foods.  Take over-the-counter and prescription medicines only as told by your health care provider.  Keep all follow-up visits as told by your health care provider. This is important. Contact a health care provider if:  You have more redness, swelling, or pain around an incision.  You have more fluid or blood coming from an incision.  Your incision feels warm to the touch.  You have a fever.  You have pain or swelling in your knee, and it gets worse.  You have pain that does not get better with medicine. Get help right away if:  You have trouble  breathing.  You have pus or a bad smell coming from an incision.  You have numbness and tingling near the knee joint. Summary  After the procedure, it is common to have knee pain with bruising and swelling on your knee, calf, and ankle.  Icing your knee and raising your leg above the level of your heart will help control the pain and the swelling.  Do physical therapy exercises as told by your health care provider. Physical therapy will help you regain strength and motion in your knee. This information is not intended to replace advice given to you by your health care provider. Make sure you discuss any questions you have with your health care provider. Document Released: 01/10/2013 Document Revised:  03/16/2016 Document Reviewed: 03/16/2016 Elsevier Interactive Patient Education  2019 [employer] General Anesthesia, Adult, Care After This sheet gives you information about how to care for yourself after your procedure. Your health care provider may also give you more specific instructions. If you have problems or questions, contact your health care provider. What can I expect after the procedure? After the procedure, the following side effects are common:  Pain or discomfort at the IV site.  Nausea.  Vomiting.  Sore throat.  Trouble concentrating.  Feeling cold or chills.  Weak or tired.  Sleepiness and fatigue.  Soreness and body aches. These side effects can affect parts of the body that were not involved in surgery. Follow these instructions at home:  For at least 24 hours after the procedure:  Have a responsible adult stay with you. It is important to have someone help care for you until you are awake and alert.  Rest as needed.  Do not: ? Participate in activities in which you could fall or become injured. ? Drive. ? Use heavy machinery. ? Drink alcohol. ? Take sleeping pills or medicines that cause drowsiness. ? Make important decisions or sign legal  documents. ? Take care of children on your own. Eating and drinking  Follow any instructions from your health care provider about eating or drinking restrictions.  When you feel hungry, start by eating small amounts of foods that are soft and easy to digest (bland), such as toast. Gradually return to your regular diet.  Drink enough fluid to keep your urine pale yellow.  If you vomit, rehydrate by drinking water, juice, or clear broth. General instructions  If you have sleep apnea, surgery and certain medicines can increase your risk for breathing problems. Follow instructions from your health care provider about wearing your sleep device: ? Anytime you are sleeping, including during daytime naps. ? While taking prescription pain medicines, sleeping medicines, or medicines that make you drowsy.  Return to your normal activities as told by your health care provider. Ask your health care provider what activities are safe for you.  Take over-the-counter and prescription medicines only as told by your health care provider.  If you smoke, do not smoke without supervision.  Keep all follow-up visits as told by your health care provider. This is important. Contact a health care provider if:  You have nausea or vomiting that does not get better with medicine.  You cannot eat or drink without vomiting.  You have pain that does not get better with medicine.  You are unable to pass urine.  You develop a skin rash.  You have a fever.  You have redness around your IV site that gets worse. Get help right away if:  You have difficulty breathing.  You have chest pain.  You have blood in your urine or stool, or you vomit blood. Summary  After the procedure, it is common to have a sore throat or nausea. It is also common to feel tired.  Have a responsible adult stay with you for the first 24 hours after general anesthesia. It is important to have someone help care for you until you  are awake and alert.  When you feel hungry, start by eating small amounts of foods that are soft and easy to digest (bland), such as toast. Gradually return to your regular diet.  Drink enough fluid to keep your urine pale yellow.  Return to your normal activities as told by your health care provider. Ask your  health care provider what activities are safe for you. This information is not intended to replace advice given to you by your health care provider. Make sure you discuss any questions you have with your health care provider. Document Released: 06/28/2000 Document Revised: 11/05/2016 Document Reviewed: 11/05/2016 Elsevier Interactive Patient Education  2019 [employer].

## 2018-08-18 ENCOUNTER — Ambulatory Visit: Payer: Medicaid Other | Admitting: Family Medicine

## 2018-08-18 ENCOUNTER — Encounter (HOSPITAL_COMMUNITY)
Admission: RE | Admit: 2018-08-18 | Discharge: 2018-08-18 | Disposition: A | Discharge status: Home / Self Care | Payer: BLUE CROSS/BLUE SHIELD | Source: Ambulatory Visit | Attending: Orthopedic Surgery | Admitting: Orthopedic Surgery

## 2018-08-18 ENCOUNTER — Encounter (HOSPITAL_COMMUNITY): Payer: Self-pay

## 2018-08-22 ENCOUNTER — Encounter (HOSPITAL_COMMUNITY): Payer: Self-pay | Admitting: Anesthesiology

## 2018-08-22 ENCOUNTER — Telehealth: Payer: Self-pay | Admitting: Radiology

## 2018-08-22 NOTE — Telephone Encounter (Signed)
FYI patient has not had Covid test, not returning calls, surgery will be cancelled.

## 2018-08-22 NOTE — Telephone Encounter (Signed)
-----   Message from Nobie Putnam sent at 08/22/2018 12:03 PM EDT ----- [name],  I have tried multiple times to reach [name] [name] - DOB [birth year] and left several messages as well to get her set up for her Covid testing.  She has already passed the 3 day window, so the only thing we would be able to do is a rapid test but it would have to be done tomorrow or her surgery will need to be cancelled.  Do you have any suggestions?  Thanks,  Eber Jones

## 2018-08-24 ENCOUNTER — Ambulatory Visit (HOSPITAL_COMMUNITY)
Admission: RE | Admit: 2018-08-24 | Payer: BLUE CROSS/BLUE SHIELD | Source: Home / Self Care | Admitting: Orthopedic Surgery

## 2018-08-24 ENCOUNTER — Encounter (HOSPITAL_COMMUNITY): Admission: RE | Payer: Self-pay | Source: Home / Self Care

## 2018-08-24 SURGERY — ARTHROSCOPY, KNEE, WITH MEDIAL MENISCECTOMY
Anesthesia: Choice | Laterality: Right

## 2018-11-17 DIAGNOSIS — Z6833 Body mass index (BMI) 33.0-33.9, adult: Secondary | ICD-10-CM | POA: Diagnosis not present

## 2018-11-17 DIAGNOSIS — Z01419 Encounter for gynecological examination (general) (routine) without abnormal findings: Secondary | ICD-10-CM | POA: Diagnosis not present

## 2018-11-17 DIAGNOSIS — B373 Candidiasis of vulva and vagina: Secondary | ICD-10-CM | POA: Diagnosis not present

## 2018-12-22 ENCOUNTER — Encounter: Payer: Self-pay | Admitting: Family Medicine

## 2018-12-22 ENCOUNTER — Ambulatory Visit (INDEPENDENT_AMBULATORY_CARE_PROVIDER_SITE_OTHER): Payer: BC Managed Care – PPO | Admitting: Family Medicine

## 2018-12-22 ENCOUNTER — Other Ambulatory Visit: Payer: Self-pay

## 2018-12-22 DIAGNOSIS — R0989 Other specified symptoms and signs involving the circulatory and respiratory systems: Secondary | ICD-10-CM | POA: Diagnosis not present

## 2018-12-22 DIAGNOSIS — R059 Cough, unspecified: Secondary | ICD-10-CM

## 2018-12-22 DIAGNOSIS — B9789 Other viral agents as the cause of diseases classified elsewhere: Secondary | ICD-10-CM

## 2018-12-22 DIAGNOSIS — J028 Acute pharyngitis due to other specified organisms: Secondary | ICD-10-CM

## 2018-12-22 DIAGNOSIS — R05 Cough: Secondary | ICD-10-CM | POA: Diagnosis not present

## 2018-12-22 MED ORDER — ALBUTEROL SULFATE (5 MG/ML) 0.5% IN NEBU: 2.5000 mg | INHALATION_SOLUTION | Freq: Four times a day (QID) | RESPIRATORY_TRACT | 12 refills | Status: DC | PRN

## 2018-12-22 NOTE — Progress Notes (Signed)
Virtual Visit via Telephone Note   This visit type was conducted due to national recommendations for restrictions regarding the COVID-19 Pandemic (e.g. social distancing) in an effort to limit this patient's exposure and mitigate transmission in our community.  Due to her co-morbid illnesses, this patient is at least at moderate risk for complications without adequate follow up.  This format is felt to be most appropriate for this patient at this time.  The patient did not have access to video technology/had technical difficulties with video requiring transitioning to audio format only (telephone).  All issues noted in this document were discussed and addressed.  No physical exam could be performed with this format.    Evaluation Performed:  Follow-up visit  Date:  12/22/2018   ID:  [name] [name], DOB 05/22/1995, MRN [id number]  Patient Location: Home Provider Location: Office  Location of Patient: Home Location of Provider: Telehealth Consent was obtain for visit to be over via telehealth. I verified that I am speaking with the correct person using two identifiers.  PCP:  [name] [name], [name] Uzelac M, NP   Chief Complaint:  Cough and chest congestion  History of Present Illness:    [name] [name] is a 23 y.o. female  Onset was stuffiness last week. This week started with cough and sore throat. In the last day started coughing up mucus which is green. Denies fevers and chills. Denies nasal drainage. Denies headaches. Denies vision changes. Denies dizziness. Has tried cold and flu, mucinex without much relief. Mucus loosened the mucus. Denies hearing rattling or wheezing. Does feel like it is hard to get air in. History of bronchitis in the past. Son has had ear infection and runny nose as well. She denies exposure to COVID.  The patient does have symptoms concerning for COVID-19 infection (fever, chills, cough, or new shortness of breath).   Past Medical, Surgical, Social History, Allergies, and  Medications have been Reviewed.  Past Medical History:  Diagnosis Date  . Gallstones   . Medical history non-contributory    Past Surgical History:  Procedure Laterality Date  . CESAREAN SECTION N/A 02/11/2017   Procedure: Primary CESAREAN SECTION;  Surgeon: [name] [name], Richard, MD;  [facility];  Service: Obstetrics;  Laterality: N/A;  EDD: 03/20/17  . CHOLECYSTECTOMY N/A 06/21/2017   Procedure: LAPAROSCOPIC CHOLECYSTECTOMY WITH INTRAOPERATIVE CHOLANGIOGRAM;  Surgeon: Darnell LevelGerkin, Todd, MD;  Location: [facility];  Service: General;  Laterality: N/A;  . NO PAST SURGERIES       No outpatient medications have been marked as taking for the 12/22/18 encounter (Appointment) with [name] [name], [name] Rieves M, NP.     Allergies:   Penicillins   Social History   Tobacco Use  . Smoking status: Former Smoker    Packs/day: 0.10    Years: 3.00    Pack years: 0.30    Types: Cigarettes  . Smokeless tobacco: Never Used  . Tobacco comment: quit 5 years ago  Substance Use Topics  . Alcohol use: Yes    Comment: occasional  . Drug use: No     Family Hx: The patient's family history includes Healthy in her brother, brother, brother, brother, sister, sister, and sister; Heart disease in her mother; Hypertension in her father; Hypertension (age of onset: 6335) in her mother; Mental retardation in her maternal uncle.  ROS:   Please see the history of present illness.    All other systems reviewed and are negative.   Labs/Other Tests and Data Reviewed:  Recent Labs: 07/04/2018: ALT 13; BUN 18; Creatinine, Ser 0.74; Hemoglobin 13.0; Platelets 351; Potassium 4.7; Sodium 138; TSH 1.570   Recent Lipid Panel Lab Results  Component Value Date/Time   CHOL 159 07/04/2018 10:32 AM   TRIG 79 07/04/2018 10:32 AM   HDL 40 07/04/2018 10:32 AM   CHOLHDL 4.0 07/04/2018 10:32 AM   LDLCALC 103 (H) 07/04/2018 10:32 AM    Wt Readings from Last 3 Encounters:  08/11/18 177 lb (80.3 kg)  08/04/18 178 lb (80.7  kg)  06/21/17 200 lb (90.7 kg)     Objective:    Vital Signs:  There were no vitals taken for this visit.   GEN:  alert and oriented RESPIRATORY:  No shortness of breath noted in conversation PSYCH:  Normal mood and affect good communication  ASSESSMENT & PLAN:    1. Cough New onset of cough in the last day.  Son is recently sick as well.  Reports that she is just now getting up a little bit of mucus mixed.  But that did not start until she started taking Mucinex.  Reports she does have a history of having bronchitis in the past.  Has nebulizer and treat machine at home.  Does not have any nebulizer solution.  We will send that in to see if that helps with some of the shortness of breath and trouble breathing that she feels.  Currently right now think this could be a viral related to the fact that her son is also been sick with a runny nose and a virus.  Educated on the signs and symptoms of COVID and when to report to the nearest emergency room secondary to the fact that she does have a little bit of trouble getting air in at this time.  Unable to assess more extensively secondary to phone visit.  - albuterol (PROVENTIL) (5 MG/ML) 0.5% nebulizer solution; Take 0.5 mLs (2.5 mg total) by nebulization every 6 (six) hours as needed for wheezing or shortness of breath.  Dispense: 20 mL; Refill: 12  2. Chest congestion Advised to continue taking the Mucinex that she has been doing because this will help loosen up the mucus that is in her chest and prevent it from settling and causing a worse chest cold.  Do not think she needs antibiotics at this time.  Provided with albuterol nebulizer solution.  Hopefully this will help keep the airway open.  Educated on signs and symptoms of COVID and when to report to the nearest emergency room.  Advised that if she does not feel like she gets any better but she does not get any worse to call the office back next week we can consider doing an antibiotic at that  time.  3. Sore throat (viral) Fully believe that she has a viral infection at this time.  Do not know if it is cold or not.  Reports that she has not been exposed to [place].  Educated on signs and symptoms of COVID and when to report to the nearest emergency room secondary to the fact that she has been exposed possibly but she also has her son who is been exposed to a virus and has ear infection 2.   Time:   Today, I have spent 10 minutes with the patient with telehealth technology discussing the above problems.     Medication Adjustments/Labs and Tests Ordered: Current medicines are reviewed at length with the patient today.  Concerns regarding medicines are outlined above.   Tests Ordered:  No orders of the defined types were placed in this encounter.   Medication Changes: No orders of the defined types were placed in this encounter.   Disposition:  Follow up 12/27/2018  Signed, [name] Finner, NP  12/22/2018 10:42 AM     [facility]

## 2018-12-22 NOTE — Patient Instructions (Signed)
    Thank you for coming into the office today. I appreciate the opportunity to provide you with the care for your health and wellness. Today we discussed: Possible chest congestion, cough, bronchitis and/or viral infection.  Follow up: As already scheduled 9/23, flu shot at that time  No labs or referrals today  I have called in the albuterol nebulizer for you to use.  Please use this as she had previously done in the past.  And as directed.  Please recall the signs and symptoms to look for to make sure that you go to the nearest emergency room if your breathing does not improve or if you get worse over the weekend.  Additionally if you do not get any worse but you do not get any better please call the office back as you might need antibiotic at that time.  Hopefully your body can fight this off with the continued use of Mucinex and albuterol to keep the airway open.  Please continue to practice social distancing to keep you, your family, and our community safe.  If you must go out, please wear a Mask and practice good handwashing.  [place] YOUR HANDS WELL AND FREQUENTLY. AVOID TOUCHING YOUR FACE, UNLESS YOUR HANDS ARE FRESHLY WASHED.  GET FRESH AIR DAILY. STAY HYDRATED WITH WATER.   It was a pleasure to see you and I look forward to continuing to work together on your health and well-being. Please do not hesitate to call the office if you need care or have questions about your care.  Have a wonderful day and week. With Gratitude, Cherly Beach, DNP, AGNP-BC

## 2018-12-26 ENCOUNTER — Telehealth: Payer: Self-pay | Admitting: *Deleted

## 2018-12-26 NOTE — Telephone Encounter (Signed)
Spoke with Morey Hummingbird at the [facility] and gave the ok for the medication to be switched to the prediluted med.

## 2018-12-26 NOTE — Telephone Encounter (Signed)
Carrie from [place] called said the albuteral nebulizer that was sent in for pt is the kind that the pt has to dilute themselves and wanted to know if this could be switched to the prefilled. Which is the albuteral 0.83. Its already diluted and cheaper. She can be reached at [phone]

## 2018-12-27 ENCOUNTER — Encounter: Payer: Self-pay | Admitting: Family Medicine

## 2018-12-27 ENCOUNTER — Ambulatory Visit (INDEPENDENT_AMBULATORY_CARE_PROVIDER_SITE_OTHER): Payer: BC Managed Care – PPO | Admitting: Family Medicine

## 2018-12-27 ENCOUNTER — Other Ambulatory Visit: Payer: Self-pay

## 2018-12-27 VITALS — BP 130/68 | HR 68 | Temp 98.4°F | Resp 14 | Ht 64.0 in | Wt 182.1 lb

## 2018-12-27 DIAGNOSIS — Z0000 Encounter for general adult medical examination without abnormal findings: Secondary | ICD-10-CM

## 2018-12-27 DIAGNOSIS — R030 Elevated blood-pressure reading, without diagnosis of hypertension: Secondary | ICD-10-CM

## 2018-12-27 DIAGNOSIS — E66811 Obesity, class 1: Secondary | ICD-10-CM

## 2018-12-27 DIAGNOSIS — E669 Obesity, unspecified: Secondary | ICD-10-CM | POA: Diagnosis not present

## 2018-12-27 NOTE — Progress Notes (Signed)
Health Maintenance reviewed   There is no immunization history for the selected administration types on file for this patient. Last Pap smear: [facility] Nov 2019 Last mammogram: n/a Last colonoscopy: n/a Last DEXA: n/a Dentist: Does not see a dentist Ophtho: 2018 wears glasses Exercise: Running; 30 minutes 4 days a week  Other doctors caring for patient include:  Patient Care Team: Freddy Finner, NP as PCP - General (Family Medicine)  End of Life Discussion:  Patient does not have a living will and medical power of attorney   Subjective:   HPI  [name] [name] is a 23 y.o. female who presents for annual wellness visit and follow-up on chronic medical conditions.  She has the following concerns: none  Review Of Systems  Review of Systems  Constitutional: Negative for chills and fever.  HENT: Negative.  Negative for dental problem.   Eyes: Negative.  Negative for visual disturbance.  Respiratory: Negative.   Cardiovascular: Negative.   Gastrointestinal: Negative.   Endocrine: Negative.   Genitourinary: Negative.   Musculoskeletal: Negative.   Skin: Negative.   Allergic/Immunologic: Negative.   Neurological: Negative.  Negative for dizziness and headaches.  Hematological: Negative.   Psychiatric/Behavioral: Negative.   All other systems reviewed and are negative.   Objective:   PHYSICAL EXAM:  BP 130/68   Pulse 68   Temp 98.4 F (36.9 C) (Oral)   Resp 14   Ht 5\' 4"  (1.626 m)   Wt 182 lb 1.9 oz (82.6 kg)   SpO2 97%   BMI 31.26 kg/m   Physical Exam Vitals signs and nursing note reviewed.  Constitutional:      General: She is awake.     Appearance: Normal appearance. She is well-developed, well-groomed and overweight.  HENT:     Head: Normocephalic and atraumatic.     Right Ear: Hearing, tympanic membrane, ear canal and external ear normal.     Left Ear: Hearing, tympanic membrane, ear canal and external ear normal.     Ears:     Weber exam  findings: does not lateralize.    Right Rinne: AC > BC.    Left Rinne: AC > BC.    Nose: Nose normal.     Mouth/Throat:     Lips: Pink.     Mouth: Mucous membranes are moist.     Tongue: No lesions.     Palate: No mass.     Pharynx: Oropharynx is clear.  Eyes:     General: Lids are normal.        Right eye: No discharge.        Left eye: No discharge.     Extraocular Movements: Extraocular movements intact.     Conjunctiva/sclera: Conjunctivae normal.     Pupils: Pupils are equal, round, and reactive to light.  Neck:     Musculoskeletal: Full passive range of motion without pain, normal range of motion and neck supple.     Thyroid: No thyroid mass, thyromegaly or thyroid tenderness.  Cardiovascular:     Rate and Rhythm: Normal rate and regular rhythm.     Pulses: Normal pulses.          Radial pulses are 2+ on the right side and 2+ on the left side.       Dorsalis pedis pulses are 2+ on the right side and 2+ on the left side.       Posterior tibial pulses are 2+ on the right side and 2+  on the left side.     Heart sounds: Normal heart sounds.  Pulmonary:     Effort: Pulmonary effort is normal.     Breath sounds: Normal breath sounds.  Abdominal:     General: Abdomen is flat. Bowel sounds are normal.     Palpations: Abdomen is soft. There is no shifting dullness, fluid wave, hepatomegaly, splenomegaly, mass or pulsatile mass.     Tenderness: There is no abdominal tenderness.  Musculoskeletal: Normal range of motion.     Right lower leg: No edema.     Left lower leg: No edema.  Lymphadenopathy:     Cervical: No cervical adenopathy.     Right cervical: No superficial, deep or posterior cervical adenopathy.    Left cervical: No superficial, deep or posterior cervical adenopathy.  Skin:    General: Skin is warm and dry.     Capillary Refill: Capillary refill takes less than 2 seconds.  Neurological:     General: No focal deficit present.     Mental Status: She is alert and  oriented to person, place, and time. Mental status is at baseline.     Cranial Nerves: Cranial nerves are intact.     Sensory: Sensation is intact.     Motor: Motor function is intact.     Coordination: Coordination is intact.     Gait: Gait is intact.     Deep Tendon Reflexes: Reflexes are normal and symmetric.  Psychiatric:        Attention and Perception: Attention and perception normal.        Mood and Affect: Mood and affect normal.        Speech: Speech normal.        Behavior: Behavior normal. Behavior is cooperative.        Thought Content: Thought content normal.        Cognition and Memory: Cognition and memory normal.        Judgment: Judgment normal.       Depression Screening  Depression screen [facility] 2/9 12/27/2018 08/04/2018 07/05/2018 06/01/2017  Decreased Interest 0 0 3 0  Down, Depressed, Hopeless 0 0 2 0  PHQ - 2 Score 0 0 5 0  Altered sleeping - 3 3 -  Tired, decreased energy - 3 3 -  Change in appetite - 0 1 -  Feeling bad or failure about yourself  - 0 2 -  Trouble concentrating - 0 3 -  Moving slowly or fidgety/restless - 0 0 -  PHQ-9 Score - 6 17 -      Assessment & Plan:    1. Annual physical exam Discussed monthly self breast exams and yearly mammograms; at least 30 minutes of aerobic activity at least 5 days/week and weight-bearing exercise 2x/week; proper sunscreen use reviewed; healthy diet, including goals of calcium and vitamin D intake and alcohol recommendations (less than or equal to 1 drink/day) reviewed; regular seatbelt use; changing batteries in smoke detectors.  Immunization recommendations discussed.    2. Obesity (BMI 30.0-34.9) Deteriorated [name] [name] is re-educated about the importance of exercise daily to help with weight management. A minumum of 30 minutes daily is recommended. Additionally, importance of healthy food choices  with portion control discussed.   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  Advised to continue this  and make sure that she is making healthy food choices.  Wt Readings from Last 3 Encounters:  12/27/18 182 lb 1.9 oz (82.6 kg)  08/11/18  177 lb (80.3 kg)  08/04/18 178 lb (80.7 kg)    3. Prehypertension [name] [name] is encouraged to maintain a well balanced diet that is low in salt. Additionally, she is encouraged to continue to exercise 30-60 minutes daily is recommended.  Follow-up: 1 year for annual or as needed   Freddy Finner, NP   12/27/2018

## 2018-12-27 NOTE — Patient Instructions (Signed)
    Thank you for coming into the office today. I appreciate the opportunity to provide you with the care for your health and wellness. Today we discussed:   Follow up: 1 year annual visit no pap, vision check  No labs or referrals today  HEALTH MAINTENANCE RECOMMENDATIONS:  It is recommended that you get at least 30 minutes of aerobic exercise at least 5 days/week (for weight loss, you may need as much as 60-90 minutes). This can be any activity that gets your heart rate up. This can be divided in 10-15 minute intervals if needed, but try and build up your endurance at least once a week.  Weight bearing exercise is also recommended twice weekly.  Eat a healthy diet with lots of vegetables, fruits and fiber.  "Colorful" foods have a lot of vitamins (ie green vegetables, tomatoes, red peppers, etc).  Limit sweet tea, regular sodas and alcoholic beverages, all of which has a lot of calories and sugar.  Up to 1 alcoholic drink daily may be beneficial for women (unless trying to lose weight, watch sugars).  Drink a lot of water.  Calcium recommendations are 1200-1500 mg daily (1500 mg for postmenopausal women or women without ovaries), and vitamin D 1000 IU daily.  This should be obtained from diet and/or supplements (vitamins), and calcium should not be taken all at once, but in divided doses.  Monthly self breast exams and yearly mammograms for women over the age of 55 is recommended.  Sunscreen of at least SPF 30 should be used on all sun-exposed parts of the skin when outside between the hours of 10 am and 4 pm (not just when at beach or pool, but even with exercise, golf, tennis, and yard work!)  Use a sunscreen that says "broad spectrum" so it covers both UVA and UVB rays, and make sure to reapply every 1-2 hours.  Remember to change the batteries in your smoke detectors when changing your clock times in the spring and fall.  Use your seat belt every time you are in a car, and please drive  safely and not be distracted with cell phones and texting while driving.  Please continue to practice social distancing to keep you, your family, and our community safe.  If you must go out, please wear a Mask and practice good handwashing.  [place] YOUR HANDS WELL AND FREQUENTLY. AVOID TOUCHING YOUR FACE, UNLESS YOUR HANDS ARE FRESHLY WASHED.  GET FRESH AIR DAILY. STAY HYDRATED WITH WATER.   It was a pleasure to see you and I look forward to continuing to work together on your health and well-being. Please do not hesitate to call the office if you need care or have questions about your care.  Have a wonderful day and week. With Gratitude, Cherly Beach, DNP, AGNP-BC

## 2019-02-09 ENCOUNTER — Telehealth: Payer: Self-pay

## 2019-02-09 NOTE — Telephone Encounter (Signed)
Pt wants to talk to the Dr regarding a sleep study and some other things that she has put off since covid

## 2019-02-13 NOTE — Telephone Encounter (Signed)
Spoke with patient and scheduled her an appt

## 2019-02-13 NOTE — Telephone Encounter (Signed)
Called patient regarding her message. No answer. VM was full so unable to leave a vm. Will try again later.

## 2019-02-16 ENCOUNTER — Other Ambulatory Visit: Payer: Self-pay

## 2019-02-16 ENCOUNTER — Ambulatory Visit: Payer: BC Managed Care – PPO | Admitting: Family Medicine

## 2019-02-20 ENCOUNTER — Ambulatory Visit (INDEPENDENT_AMBULATORY_CARE_PROVIDER_SITE_OTHER): Payer: BC Managed Care – PPO | Admitting: Family Medicine

## 2019-02-20 ENCOUNTER — Encounter: Payer: Self-pay | Admitting: Family Medicine

## 2019-02-20 ENCOUNTER — Other Ambulatory Visit: Payer: Self-pay

## 2019-02-20 VITALS — Ht 64.0 in | Wt 189.0 lb

## 2019-02-20 DIAGNOSIS — K219 Gastro-esophageal reflux disease without esophagitis: Secondary | ICD-10-CM

## 2019-02-20 DIAGNOSIS — R0981 Nasal congestion: Secondary | ICD-10-CM

## 2019-02-20 DIAGNOSIS — R0683 Snoring: Secondary | ICD-10-CM

## 2019-02-20 DIAGNOSIS — R400 Somnolence: Secondary | ICD-10-CM | POA: Diagnosis not present

## 2019-02-20 DIAGNOSIS — F32A Depression, unspecified: Secondary | ICD-10-CM

## 2019-02-20 DIAGNOSIS — J302 Other seasonal allergic rhinitis: Secondary | ICD-10-CM

## 2019-02-20 DIAGNOSIS — F329 Major depressive disorder, single episode, unspecified: Secondary | ICD-10-CM

## 2019-02-20 MED ORDER — FAMOTIDINE 20 MG PO TABS: 20.0000 mg | ORAL_TABLET | Freq: Two times a day (BID) | ORAL | 1 refills | Status: DC

## 2019-02-20 MED ORDER — CETIRIZINE HCL 5 MG PO TABS: 5.0000 mg | ORAL_TABLET | Freq: Every day | ORAL | 1 refills | Status: DC

## 2019-02-20 MED ORDER — FLUTICASONE PROPIONATE 50 MCG/ACT NA SUSP: 2.0000 | Freq: Every day | NASAL | 1 refills | Status: DC

## 2019-02-20 NOTE — Progress Notes (Signed)
Virtual Visit via Telephone Note   This visit type was conducted due to national recommendations for restrictions regarding the COVID-19 Pandemic (e.g. social distancing) in an effort to limit this patient's exposure and mitigate transmission in our community.  Due to her co-morbid illnesses, this patient is at least at moderate risk for complications without adequate follow up.  This format is felt to be most appropriate for this patient at this time.  The patient did not have access to video technology/had technical difficulties with video requiring transitioning to audio format only (telephone).  All issues noted in this document were discussed and addressed.  No physical exam could be performed with this format.    Evaluation Performed:  Follow-up visit  Date:  02/20/2019   ID:  [name] [name], DOB 12/03/1995, MRN [id number]  Patient Location: Home Provider Location: Office  Location of Patient: Home Location of Provider: Telehealth Consent was obtain for visit to be over via telehealth. I verified that I am speaking with the correct person using two identifiers.  PCP:  [name] Finner, NP   Chief Complaint:  Snoring   History of Present Illness:    [name] [name] is a 23 y.o. female with limited history.  Reports today that she would like to see about getting a sleep study because she wakes up in the middle the night and sometimes in the morning from snoring and gasping for air.  Reports that she is feeling exhausted during the day.  Feels like her nostrils her throat close up at night.  Has tried to take Sudafed, nasal spray she is unsure of the name at this time.  In addition to using Breathe Right strips nothing is helped.  She reports that she has enlarged tonsils and is worried that they might contribute to the fullness in her throat when she is sleeping.  She does report that she wakes up with a bad taste in her mouth and constantly having to clear her throat.  She does not  feel like she has burning sensations at this time.  Or known heartburn-like symptoms. She reports that her nostrils and throat are stuffy at night.  Has tried to use nasal spray and Sudafed to help keep them open.  Reports sometimes that it helps and sometimes that it does not help.  She wants to make sure that she gets this taken care of as she does not want to develop sleep apnea if she does not have it or high blood pressure or other heart related problems that she knows that can be caused by untreated sleep disorder.  The patient does not have symptoms concerning for COVID-19 infection (fever, chills, cough, or new shortness of breath).   Today patient denies signs and symptoms of COVID 19 infection including fever, chills, cough, shortness of breath, and headache. Past Medical History:  Diagnosis Date   Gallstones    Medical history non-contributory    Past Surgical History:  Procedure Laterality Date   CESAREAN SECTION N/A 02/11/2017   Procedure: Primary CESAREAN SECTION;  Surgeon: Olivia Mackie, MD;  Location: [facility];  Service: Obstetrics;  Laterality: N/A;  EDD: 03/20/17   CHOLECYSTECTOMY N/A 06/21/2017   Procedure: LAPAROSCOPIC CHOLECYSTECTOMY WITH INTRAOPERATIVE CHOLANGIOGRAM;  Surgeon: Darnell Level, MD;  Location: [facility];  Service: General;  Laterality: N/A;   NO PAST SURGERIES       No outpatient medications have been marked as taking for the 02/20/19 encounter (Office Visit) with  Perlie Mayo, NP.     Allergies:   Penicillins   Social History   Tobacco Use   Smoking status: Former Smoker    Packs/day: 0.10    Years: 3.00    Pack years: 0.30    Types: Cigarettes   Smokeless tobacco: Never Used   Tobacco comment: quit 5 years ago  Substance Use Topics   Alcohol use: Yes    Comment: occasional   Drug use: No     Family Hx: The patient's family history includes Healthy in her brother, brother, brother, brother, sister, sister, and sister;  Heart disease in her mother; Hypertension in her father; Hypertension (age of onset: 20) in her mother; Mental retardation in her maternal uncle.  ROS:   Please see the history of present illness.    All other systems reviewed and are negative.   Labs/Other Tests and Data Reviewed:     Recent Labs: 07/04/2018: ALT 13; BUN 18; Creatinine, Ser 0.74; Hemoglobin 13.0; Platelets 351; Potassium 4.7; Sodium 138; TSH 1.570   Recent Lipid Panel Lab Results  Component Value Date/Time   CHOL 159 07/04/2018 10:32 AM   TRIG 79 07/04/2018 10:32 AM   HDL 40 07/04/2018 10:32 AM   CHOLHDL 4.0 07/04/2018 10:32 AM   LDLCALC 103 (H) 07/04/2018 10:32 AM    Wt Readings from Last 3 Encounters:  02/20/19 189 lb (85.7 kg)  12/27/18 182 lb 1.9 oz (82.6 kg)  08/11/18 177 lb (80.3 kg)     Objective:    Vital Signs:  Ht 5\' 4"  (1.626 m)    Wt 189 lb (85.7 kg)    BMI 32.44 kg/m    GEN:  alert and oriented RESPIRATORY:  no shortness of breath noted in conversation PSYCH:  normal mood and affect, good communication   Depression screen [facility] 2/9 02/20/2019 12/27/2018 08/04/2018  Decreased Interest 0 0 0  Down, Depressed, Hopeless 3 0 0  PHQ - 2 Score 3 0 0  Altered sleeping 3 - 3  Tired, decreased energy 3 - 3  Change in appetite 3 - 0  Feeling bad or failure about yourself  0 - 0  Trouble concentrating 3 - 0  Moving slowly or fidgety/restless 3 - 0  Suicidal thoughts 0 - -  PHQ-9 Score 18 - 6     ASSESSMENT & PLAN:    1. Snoring Reports excessive snoring daily waking up feels like she is choking or strangling gasping for air has tried several over-the-counter medications including Breathe Right strips without success.  Concerned that her tonsils could be causing part of the issue.  Referral to ENT to assess anatomy to see if there is anything that can be done for possible need for T&A sx. appreciate collaboration in her care.  Please let PCP know if there is anything I can do them I am. Pending  treatments with the below medications and appointment with ENT we will decide whether or not sleep study would be appropriate.  - Ambulatory referral to ENT  2. Daytime sleepiness As stated above snoring and gasping for air possible apnea.  Daytime sleepiness.  Will look to treat allergies and/or possible reflux issues as well as sending to ENT for anatomy assessment.  Again appreciate collaboration in her care.  - Ambulatory referral to ENT  3. Gastroesophageal reflux disease, unspecified whether esophagitis present She reports that she wakes up with a sour taste in mouth sometimes and constantly feels like she has to swallow or clear her  throat.  Could be related to postnasal drip and/or acid reflux/GERD.  Will be starting on Pepcid to see if that is one of the causes.  Referral to ENT for anatomy assessment.  - famotidine (PEPCID) 20 MG tablet; Take 1 tablet (20 mg total) by mouth 2 (two) times daily.  Dispense: 30 tablet; Refill: 1 - Ambulatory referral to ENT  4. Seasonal allergies She reports that she feels like her throat closes up and her nose closes up she has trouble breathing.  Has been trying over-the-counter medications without success.  Reports use of Sudafed and some nasal spray that she is unsure the name of the time.  We will start her on daily Zyrtec and Flonase to see if there is any allergy component to her symptoms.  Again referral to ENT for anatomy assessment.  - cetirizine (ZYRTEC) 5 MG tablet; Take 1 tablet (5 mg total) by mouth daily.  Dispense: 30 tablet; Refill: 1 - fluticasone (FLONASE) 50 MCG/ACT nasal spray; Place 2 sprays into both nostrils daily.  Dispense: 16 g; Refill: 1 - Ambulatory referral to ENT  5. Stuffy nose See #4  - cetirizine (ZYRTEC) 5 MG tablet; Take 1 tablet (5 mg total) by mouth daily.  Dispense: 30 tablet; Refill: 1 - fluticasone (FLONASE) 50 MCG/ACT nasal spray; Place 2 sprays into both nostrils daily.  Dispense: 16 g; Refill: 1 -  Ambulatory referral to ENT  6. Depression, unspecified depression type Reports that she has increased depression secondary to just being so tired and rundown.  Declined starting any medication at this time would like just to see if she can get better sleep with the treatments of acid reflux/GERD and/or possible allergies in addition to seeing the ENT.  Close follow-up in 1 month.  Time:   Today, I have spent 12 minutes with the patient with telehealth technology discussing the above problems.     Medication Adjustments/Labs and Tests Ordered: Current medicines are reviewed at length with the patient today.  Concerns regarding medicines are outlined above.   Tests Ordered: Orders Placed This Encounter  Procedures   Ambulatory referral to ENT    Medication Changes: Meds ordered this encounter  Medications   cetirizine (ZYRTEC) 5 MG tablet    Sig: Take 1 tablet (5 mg total) by mouth daily.    Dispense:  30 tablet    Refill:  1    Order Specific Question:   Supervising Provider    Answer:   Lodema HongSIMPSON, MARGARET E [[id number]]   fluticasone (FLONASE) 50 MCG/ACT nasal spray    Sig: Place 2 sprays into both nostrils daily.    Dispense:  16 g    Refill:  1    Order Specific Question:   Supervising Provider    Answer:   Lodema HongSIMPSON, MARGARET E [[id number]]   famotidine (PEPCID) 20 MG tablet    Sig: Take 1 tablet (20 mg total) by mouth 2 (two) times daily.    Dispense:  30 tablet    Refill:  1    Order Specific Question:   Supervising Provider    Answer:   Kerri PerchesSIMPSON, MARGARET E [[id number]]    Disposition:  Follow up 1 month   Signed, [name] FinnerHannah M Chetara Kropp, NP  02/20/2019 9:55 AM     [facility]

## 2019-02-20 NOTE — Patient Instructions (Signed)
  I appreciate the opportunity to provide you with care for your health and wellness. Today we discussed: Snoring, daytime sleepiness, GERD, seasonal allergies  Follow up: 1 month  No labs  Referral to ENT for anatomy assessment secondary to excessive snoring  Start medications: For acid reflux would like you to start taking Pepcid twice a day.  For allergies that like you start taking Zyrtec daily and using Flonase nasal spray daily as well.  After your appointment with the ENT we can assess whether or not these have been beneficial for you or if your symptoms have improved.  Pending ENT assessments and the above medication use and how you are feeling we can look at possible need for sleep study.  I hope you have a wonderful, happy, safe, and healthy Holiday Season!  Please continue to practice social distancing to keep you, your family, and our community safe.  If you must go out, please wear a mask and practice good handwashing.  It was a pleasure to see you and I look forward to continuing to work together on your health and well-being. Please do not hesitate to call the office if you need care or have questions about your care.  Have a wonderful day and week. With Gratitude, Cherly Beach, DNP, AGNP-BC

## 2019-02-27 ENCOUNTER — Other Ambulatory Visit: Payer: Self-pay

## 2019-02-27 DIAGNOSIS — Z20822 Contact with and (suspected) exposure to covid-19: Secondary | ICD-10-CM

## 2019-02-28 LAB — NOVEL CORONAVIRUS, NAA: SARS-CoV-2, NAA: NOT DETECTED

## 2019-03-06 DIAGNOSIS — R1032 Left lower quadrant pain: Secondary | ICD-10-CM | POA: Diagnosis not present

## 2019-03-06 DIAGNOSIS — B373 Candidiasis of vulva and vagina: Secondary | ICD-10-CM | POA: Diagnosis not present

## 2019-03-06 DIAGNOSIS — N760 Acute vaginitis: Secondary | ICD-10-CM | POA: Diagnosis not present

## 2019-03-06 DIAGNOSIS — Z3200 Encounter for pregnancy test, result unknown: Secondary | ICD-10-CM | POA: Diagnosis not present

## 2019-03-06 DIAGNOSIS — R300 Dysuria: Secondary | ICD-10-CM | POA: Diagnosis not present

## 2019-03-21 ENCOUNTER — Other Ambulatory Visit: Payer: Self-pay

## 2019-03-21 ENCOUNTER — Ambulatory Visit (INDEPENDENT_AMBULATORY_CARE_PROVIDER_SITE_OTHER): Payer: BC Managed Care – PPO | Admitting: Family Medicine

## 2019-03-21 ENCOUNTER — Encounter: Payer: Self-pay | Admitting: Family Medicine

## 2019-03-21 DIAGNOSIS — J339 Nasal polyp, unspecified: Secondary | ICD-10-CM | POA: Insufficient documentation

## 2019-03-21 DIAGNOSIS — F321 Major depressive disorder, single episode, moderate: Secondary | ICD-10-CM | POA: Insufficient documentation

## 2019-03-21 DIAGNOSIS — F331 Major depressive disorder, recurrent, moderate: Secondary | ICD-10-CM

## 2019-03-21 DIAGNOSIS — F3341 Major depressive disorder, recurrent, in partial remission: Secondary | ICD-10-CM | POA: Insufficient documentation

## 2019-03-21 MED ORDER — SERTRALINE HCL 50 MG PO TABS: 50.0000 mg | ORAL_TABLET | Freq: Every day | ORAL | 3 refills | Status: DC

## 2019-03-21 NOTE — Assessment & Plan Note (Signed)
GNY was able to get her on her Zoloft medication she has been on this for about 3 weeks now. Reports that she is doing really well with this and would like refills. PHQ-9 has decreased by 6 points PHQ 9 last month was at 18 PHQ-9 today is at 12. Encouraged her to do self-care needs as we have discussed in the past. Close follow-up. Denies having any suicidal ideations or homicidal ideations.

## 2019-03-21 NOTE — Progress Notes (Signed)
Virtual Visit via Telephone Note   This visit type was conducted due to national recommendations for restrictions regarding the COVID-19 Pandemic (e.g. social distancing) in an effort to limit this patient's exposure and mitigate transmission in our community.  Due to her co-morbid illnesses, this patient is at least at moderate risk for complications without adequate follow up.  This format is felt to be most appropriate for this patient at this time.  The patient did not have access to video technology/had technical difficulties with video requiring transitioning to audio format only (telephone).  All issues noted in this document were discussed and addressed.  No physical exam could be performed with this format.     Evaluation Performed:  Follow-up visit  Date:  03/21/2019   ID:  [name] [name], DOB 11-Sep-1995, MRN [id number]  Patient Location: Home Provider Location: Office  Location of Patient: Home Location of Provider: Telehealth Consent was obtain for visit to be over via telehealth. I verified that I am speaking with the correct person using two identifiers.  PCP:  Freddy Finner, NP   Chief Complaint: Mood  History of Present Illness:    [name] [name] is a 23 y.o. female with limited history. Reports today for follow-up regarding mood. And ENT referral that I had placed at last visit a month ago.  Reports that she has not had her call form the ENT. Now reports that she has a red bump/polyp in her nose. Is taking her Zyrtec and Flonase as directed. Overall is feeling better allergy wise. But feels like she needs to get seen by the ENT to help with the snoring and overall sleepiness and now this new bump in her nose.  Mood wise she has started on Zoloft about 3 weeks ago from her GYN-previously declined medication at last visit thought that it was mostly related to feeling tired and rundown. Reports that she was not sleeping well was started for treatment on acid reflux and  allergies which she says she feels like she has some improvement in a sleeping a little bit better but overall is not where she thinks she needs to be. Demonstrates that she can tell an improvement. Overall is feeling a little bit better about things. Would like to continue the Zoloft and needs refills today.  The patient does not have symptoms concerning for COVID-19 infection (fever, chills, cough, or new shortness of breath).   Past Medical, Surgical, Social History, Allergies, and Medications have been Reviewed.  Past Medical History:  Diagnosis Date  . Gallstones   . Medical history non-contributory    Past Surgical History:  Procedure Laterality Date  . CESAREAN SECTION N/A 02/11/2017   Procedure: Primary CESAREAN SECTION;  Surgeon: Olivia Mackie, MD;  Location: [facility] BIRTHING SUITES;  Service: Obstetrics;  Laterality: N/A;  EDD: 03/20/17  . CHOLECYSTECTOMY N/A 06/21/2017   Procedure: LAPAROSCOPIC CHOLECYSTECTOMY WITH INTRAOPERATIVE CHOLANGIOGRAM;  Surgeon: Darnell Level, MD;  Location: [facility] ORS;  Service: General;  Laterality: N/A;  . NO PAST SURGERIES       No outpatient medications have been marked as taking for the 03/21/19 encounter (Office Visit) with Freddy Finner, NP.     Allergies:   Penicillins   Social History   Tobacco Use  . Smoking status: Former Smoker    Packs/day: 0.10    Years: 3.00    Pack years: 0.30    Types: Cigarettes  . Smokeless tobacco: Never Used  . Tobacco comment: quit  5 years ago  Substance Use Topics  . Alcohol use: Yes    Comment: occasional  . Drug use: No     Family Hx: The patient's family history includes Healthy in her brother, brother, brother, brother, sister, sister, and sister; Heart disease in her mother; Hypertension in her father; Hypertension (age of onset: 34) in her mother; Mental retardation in her maternal uncle.  ROS:   Please see the history of present illness.    All other systems reviewed and are  negative.   Labs/Other Tests and Data Reviewed:    Recent Labs: 07/04/2018: ALT 13; BUN 18; Creatinine, Ser 0.74; Hemoglobin 13.0; Platelets 351; Potassium 4.7; Sodium 138; TSH 1.570   Recent Lipid Panel Lab Results  Component Value Date/Time   CHOL 159 07/04/2018 10:32 AM   TRIG 79 07/04/2018 10:32 AM   HDL 40 07/04/2018 10:32 AM   CHOLHDL 4.0 07/04/2018 10:32 AM   LDLCALC 103 (H) 07/04/2018 10:32 AM    Wt Readings from Last 3 Encounters:  02/20/19 189 lb (85.7 kg)  12/27/18 182 lb 1.9 oz (82.6 kg)  08/11/18 177 lb (80.3 kg)     Objective:    Vital Signs:  There were no vitals taken for this visit.   GEN:  Alert and oriented RESPIRATORY:  No shortness of breath noted in conversation PSYCH:  Normal affect, mood, good communication  PHQ9 SCORE ONLY 03/21/2019 02/20/2019 12/27/2018  Score 12 18 0     ASSESSMENT & PLAN:    1. Moderate episode of recurrent major depressive disorder ([place]) GYN was able to get her on her Zoloft medication she has been on this for about 3 weeks now. Reports that she is doing really well with this and would like refills. PHQ-9 has decreased by 6 points PHQ 9 last month was at 18 PHQ-9 today is at 12. Encouraged her to do self-care needs as we have discussed in the past. Close follow-up.   - sertraline (ZOLOFT) 50 MG tablet; Take 1 tablet (50 mg total) by mouth daily.  Dispense: 90 tablet; Refill: 3  2. Nasal polyp Questionable nasal polyp unable to do PE secondary to phone visit. Have sent in the referral for ENT need to follow-up with them we will have our office call their office to see where they are at in the referral process as it is been a month. And she reports not hearing back from them. Continue all allergy medications as directed.   Time:   Today, I have spent  minutes with the patient with telehealth technology discussing the above problems.     Medication Adjustments/Labs and Tests Ordered: Current medicines are reviewed at  length with the patient today.  Concerns regarding medicines are outlined above.   Tests Ordered: No orders of the defined types were placed in this encounter.   Medication Changes: Meds ordered this encounter  Medications  . sertraline (ZOLOFT) 50 MG tablet    Sig: Take 1 tablet (50 mg total) by mouth daily.    Dispense:  90 tablet    Refill:  3    Order Specific Question:   Supervising Provider    Answer:   Fayrene Helper [[id number]]    Disposition:  Follow up 3 months   Signed, Perlie Mayo, NP  03/21/2019 9:32 AM     [facility]

## 2019-03-21 NOTE — Assessment & Plan Note (Signed)
Questionable nasal polyp unable to do PE secondary to phone visit. Have sent in the referral for ENT need to follow-up with them we will have our office call their office to see where they are at in the referral process as it is been a month. And she reports not hearing back from them. Continue all allergy medications as directed.

## 2019-03-21 NOTE — Patient Instructions (Addendum)
  I appreciate the opportunity to provide you with care for your health and wellness. Today we discussed: Mood and ENT referral  Follow up: 3 months  No labs or referrals today  We will reach out to ENT office to see where your referral is.  I am glad that the Zoloft seems to be doing its job for you. Make sure you still make time to do self-care things as we talked about in the past. Taking care of your mental health is so valuable for how you feel overall.  I hope the new job goes well.  I hope you have a wonderful, happy, safe, and healthy Holiday Season! See you in the New Year :)  Please continue to practice social distancing to keep you, your family, and our community safe.  If you must go out, please wear a mask and practice good handwashing.  It was a pleasure to see you and I look forward to continuing to work together on your health and well-being. Please do not hesitate to call the office if you need care or have questions about your care.  Have a wonderful day and week. With Gratitude, Cherly Beach, DNP, AGNP-BC

## 2019-03-22 ENCOUNTER — Ambulatory Visit: Payer: BC Managed Care – PPO | Admitting: Family Medicine

## 2019-04-03 DIAGNOSIS — R400 Somnolence: Secondary | ICD-10-CM | POA: Diagnosis not present

## 2019-04-03 DIAGNOSIS — K219 Gastro-esophageal reflux disease without esophagitis: Secondary | ICD-10-CM | POA: Diagnosis not present

## 2019-04-03 DIAGNOSIS — R0989 Other specified symptoms and signs involving the circulatory and respiratory systems: Secondary | ICD-10-CM | POA: Diagnosis not present

## 2019-04-03 DIAGNOSIS — R0683 Snoring: Secondary | ICD-10-CM | POA: Diagnosis not present

## 2019-05-11 ENCOUNTER — Other Ambulatory Visit: Payer: Self-pay

## 2019-05-11 ENCOUNTER — Other Ambulatory Visit: Payer: BC Managed Care – PPO

## 2019-05-11 ENCOUNTER — Ambulatory Visit: Payer: BC Managed Care – PPO | Attending: Internal Medicine

## 2019-05-11 DIAGNOSIS — Z20822 Contact with and (suspected) exposure to covid-19: Secondary | ICD-10-CM

## 2019-05-13 LAB — NOVEL CORONAVIRUS, NAA: SARS-CoV-2, NAA: NOT DETECTED

## 2019-06-11 DIAGNOSIS — J342 Deviated nasal septum: Secondary | ICD-10-CM | POA: Insufficient documentation

## 2019-06-11 DIAGNOSIS — J3489 Other specified disorders of nose and nasal sinuses: Secondary | ICD-10-CM | POA: Diagnosis not present

## 2019-06-11 DIAGNOSIS — M950 Acquired deformity of nose: Secondary | ICD-10-CM | POA: Diagnosis not present

## 2019-06-11 DIAGNOSIS — F17200 Nicotine dependence, unspecified, uncomplicated: Secondary | ICD-10-CM | POA: Diagnosis not present

## 2019-06-19 ENCOUNTER — Encounter: Payer: Self-pay | Admitting: Family Medicine

## 2019-06-19 ENCOUNTER — Other Ambulatory Visit: Payer: Self-pay

## 2019-06-19 ENCOUNTER — Ambulatory Visit (INDEPENDENT_AMBULATORY_CARE_PROVIDER_SITE_OTHER): Payer: BC Managed Care – PPO | Admitting: Family Medicine

## 2019-06-19 VITALS — BP 120/70 | Ht 64.0 in | Wt 187.0 lb

## 2019-06-19 DIAGNOSIS — J339 Nasal polyp, unspecified: Secondary | ICD-10-CM | POA: Diagnosis not present

## 2019-06-19 DIAGNOSIS — F321 Major depressive disorder, single episode, moderate: Secondary | ICD-10-CM | POA: Diagnosis not present

## 2019-06-19 DIAGNOSIS — E669 Obesity, unspecified: Secondary | ICD-10-CM | POA: Diagnosis not present

## 2019-06-19 DIAGNOSIS — Z720 Tobacco use: Secondary | ICD-10-CM | POA: Insufficient documentation

## 2019-06-19 DIAGNOSIS — Z716 Tobacco abuse counseling: Secondary | ICD-10-CM

## 2019-06-19 MED ORDER — BUPROPION HCL ER (XL) 150 MG PO TB24: 150.0000 mg | ORAL_TABLET | Freq: Every day | ORAL | 1 refills | Status: DC

## 2019-06-19 NOTE — Assessment & Plan Note (Signed)
Asked about quitting: confirms they are currently smoking e cigarettes Advise to quit smoking: Educated about QUITTING to reduce the risk of cancer, cardio and cerebrovascular disease. Assess willingness: willing to quit at this time, but is working on cutting back. Assist with counseling and pharmacotherapy: Counseled for 5 minutes and literature provided. Arrange for follow up: working quitting follow up in 3 months and continue to offer help.    Starting Wellbutrin

## 2019-06-19 NOTE — Assessment & Plan Note (Signed)
[name] [name] is educated about the importance of exercise daily to help with weight management. A minumum of 30 minutes daily is recommended. Additionally, importance of healthy food choices  with portion control discussed.   Wt Readings from Last 3 Encounters:  06/19/19 187 lb (84.8 kg)  02/20/19 189 lb (85.7 kg)  12/27/18 182 lb 1.9 oz (82.6 kg)

## 2019-06-19 NOTE — Patient Instructions (Signed)
I appreciate the opportunity to provide you with care for your health and wellness. Today we discussed: smoking cessation and mood  Follow up: 3.5-4 weeks   No labs or referrals today  Start taking Wellbutrin as directed.  Please continue to practice social distancing to keep you, your family, and our community safe.  If you must go out, please wear a mask and practice good handwashing.  It was a pleasure to see you and I look forward to continuing to work together on your health and well-being. Please do not hesitate to call the office if you need care or have questions about your care.  Have a wonderful day and week. With Gratitude, Tereasa Coop, DNP, AGNP-BC  Quick Tips to Quit Smoking: . Fix a date i.e. keep a date in mind from when you would not touch a tobacco product to smoke  . Keep yourself busy and block your mind with work loads or reading books or watching movies in malls where smoking is not allowed  . Vanish off the things which reminds you about smoking for example match box, or your favorite lighter, or the pipe you used for smoking, or your favorite jeans and shirt with which you used to enjoy smoking, or the club where you used to do smoking  . Try to avoid certain people places and incidences where and with whom smoking is a common factor to add on  . Praise yourself with some token gifts from the money you saved by stopping smoking  . Anti Smoking teams are there to help you. Join their programs  . Anti-smoking Gums are there in many medical shops. Try them to quit smoking   Side-effects of Smoking: . Disease caused by smoking cigarettes are emphysema, bronchitis, heart failures  . Premature death  . Cancer is the major side effect of smoking  . Heart attacks and strokes are the quick effects of smoking causing sudden death  . Some smokers lives end up with limbs amputated  . Breathing problem or fast breathing is another side effect of smoking  . Due to more  intakes of smokes, carbon mono-oxide goes into your brain and other muscles of the body which leads to swelling of the veins and blockage to the air passage to lungs  . Carbon monoxide blocks blood vessels which leads to blockage in the flow of blood to different major body organs like heart lungs and thus leads to attacks and deaths  . During pregnancy smoking is very harmful and leads to premature birth of the infant, spontaneous abortions, low weight of the infant during birth  . Fat depositions to narrow and blocked blood vessels causing heart attacks  . In many cases cigarette smoking caused infertility in men

## 2019-06-19 NOTE — Progress Notes (Signed)
Virtual Visit via Telephone Note   This visit type was conducted due to national recommendations for restrictions regarding the COVID-19 Pandemic (e.g. social distancing) in an effort to limit this patient's exposure and mitigate transmission in our community.  Due to her co-morbid illnesses, this patient is at least at moderate risk for complications without adequate follow up.  This format is felt to be most appropriate for this patient at this time.  The patient did not have access to video technology/had technical difficulties with video requiring transitioning to audio format only (telephone).  All issues noted in this document were discussed and addressed.  No physical exam could be performed with this format.    Evaluation Performed:  Follow-up visit  Date:  06/19/2019   ID:  [name] [name], DOB [birth year], MRN [id number]  Patient Location: Home Provider Location: Office  Location of Patient: Home Location of Provider: Telehealth Consent was obtain for visit to be over via telehealth. I verified that I am speaking with the correct person using two identifiers.  PCP:  Freddy Finner, NP   Chief Complaint:  Wants to stop smoking  History of Present Illness:    [name] [name] is a 24 y.o. female with history of headaches, depression, ADHD and smoker.  She presents today to discuss smoking cessation. She is on Zoloft, but is willing to have that change. If a different medication would help her stop smoking. She is using e-cigs right now. She reports needing nasal reconstruction surgery and they wanted her to stop smoking prior to the surgery. She has quit in the past, when she smoked cigarettes, but she is finding the e-cigarettes to be harder to stop.  The patient does not have symptoms concerning for COVID-19 infection (fever, chills, cough, or new shortness of breath).   Past Medical, Surgical, Social History, Allergies, and Medications have been Reviewed.  Past Medical  History:  Diagnosis Date  . Cholelithiasis with chronic cholecystitis 06/21/2017  . Closed right ankle fracture 06/15/2012   Overview:  recurrent Formatting of this note might be different from the original. Overview:  recurrent  . Gallstones   . History of attention deficit hyperactivity disorder (ADHD) 06/23/2015  . Medical history non-contributory   . Stress headaches 06/23/2015   Past Surgical History:  Procedure Laterality Date  . CESAREAN SECTION N/A 02/11/2017   Procedure: Primary CESAREAN SECTION;  Surgeon: Olivia Mackie, MD;  Location: [facility] BIRTHING SUITES;  Service: Obstetrics;  Laterality: N/A;  EDD: 03/20/17  . CHOLECYSTECTOMY N/A 06/21/2017   Procedure: LAPAROSCOPIC CHOLECYSTECTOMY WITH INTRAOPERATIVE CHOLANGIOGRAM;  Surgeon: Darnell Level, MD;  Location: [facility]S;  Service: General;  Laterality: N/A;  . NO PAST SURGERIES       Current Meds  Medication Sig  . cetirizine (ZYRTEC) 5 MG tablet Take 1 tablet (5 mg total) by mouth daily.  . famotidine (PEPCID) 20 MG tablet Take 1 tablet (20 mg total) by mouth 2 (two) times daily.  . [DISCONTINUED] fluticasone (FLONASE) 50 MCG/ACT nasal spray Place 2 sprays into both nostrils daily.  . [DISCONTINUED] sertraline (ZOLOFT) 50 MG tablet Take 1 tablet (50 mg total) by mouth daily.     Allergies:   Penicillins   ROS:   Please see the history of present illness.    All other systems reviewed and are negative.   Labs/Other Tests and Data Reviewed:    Recent Labs: 07/04/2018: ALT 13; BUN 18; Creatinine, Ser 0.74; Hemoglobin 13.0; Platelets 351; Potassium 4.7; Sodium 138;  TSH 1.570   Recent Lipid Panel Lab Results  Component Value Date/Time   CHOL 159 07/04/2018 10:32 AM   TRIG 79 07/04/2018 10:32 AM   HDL 40 07/04/2018 10:32 AM   CHOLHDL 4.0 07/04/2018 10:32 AM   LDLCALC 103 (H) 07/04/2018 10:32 AM    Wt Readings from Last 3 Encounters:  06/19/19 187 lb (84.8 kg)  02/20/19 189 lb (85.7 kg)  12/27/18 182 lb 1.9 oz (82.6 kg)       Objective:    Vital Signs:  BP 120/70   Ht 5\' 4"  (1.626 m)   Wt 187 lb (84.8 kg)   BMI 32.10 kg/m    VITAL SIGNS:  reviewed GEN:  no acute distress RESPIRATORY:  no shortness of breath in conversation PSYCH:  normal affect and mood  ASSESSMENT & PLAN:    1. Depression, major, single episode, moderate ([place])  2. Encounter for smoking cessation counseling  3. Nasal polyp  4. Obesity (BMI 30.0-34.9)  Please see A&P for details of Dx.  Time:   Today, I have spent 20 minutes with the patient with telehealth technology discussing the above problems.     Medication Adjustments/Labs and Tests Ordered: Current medicines are reviewed at length with the patient today.  Concerns regarding medicines are outlined above.   Tests Ordered: No orders of the defined types were placed in this encounter.   Medication Changes: No orders of the defined types were placed in this encounter.   Disposition:  Follow up 3.5 weeks  Signed, Perlie Mayo, NP  06/19/2019 8:33 AM     [facility]

## 2019-07-16 ENCOUNTER — Other Ambulatory Visit: Payer: Self-pay

## 2019-07-16 ENCOUNTER — Ambulatory Visit
Admission: EM | Admit: 2019-07-16 | Discharge: 2019-07-16 | Disposition: A | Discharge status: Home / Self Care | Payer: BC Managed Care – PPO | Attending: Family Medicine | Admitting: Family Medicine

## 2019-07-16 DIAGNOSIS — J0140 Acute pansinusitis, unspecified: Secondary | ICD-10-CM

## 2019-07-16 DIAGNOSIS — R059 Cough, unspecified: Secondary | ICD-10-CM

## 2019-07-16 DIAGNOSIS — Z20822 Contact with and (suspected) exposure to covid-19: Secondary | ICD-10-CM | POA: Diagnosis not present

## 2019-07-16 DIAGNOSIS — R05 Cough: Secondary | ICD-10-CM | POA: Insufficient documentation

## 2019-07-16 LAB — POCT RAPID STREP A (OFFICE): Rapid Strep A Screen: NEGATIVE

## 2019-07-16 MED ORDER — FLUCONAZOLE 150 MG PO TABS: 150.0000 mg | ORAL_TABLET | Freq: Once | ORAL | 0 refills | Status: AC

## 2019-07-16 MED ORDER — CEFDINIR 300 MG PO CAPS: 300.0000 mg | ORAL_CAPSULE | Freq: Two times a day (BID) | ORAL | 0 refills | Status: AC

## 2019-07-16 MED ORDER — BENZONATATE 100 MG PO CAPS: 100.0000 mg | ORAL_CAPSULE | Freq: Three times a day (TID) | ORAL | 0 refills | Status: DC | PRN

## 2019-07-16 NOTE — ED Triage Notes (Signed)
Pt presents with c/o cough and sore throat for past couple of days

## 2019-07-16 NOTE — ED Provider Notes (Signed)
[facility]    CSN: [id number] Arrival date & time: 07/16/19  1016      History   Chief Complaint Chief Complaint  Patient presents with  . Cough    HPI [name] [name] is a 24 y.o. female.   HPI  Patient concern for possible upper respiratory infection. Initially thought symptoms were allergies. Experienced nasal congestion and sneezing initially. She is currently experiencing cough, sore throat,  nasal drainage and chest congestion. She is a smoker of e-cigarettes. Denies wheezing or difficulty breathing. Symptoms present for approximately 1 week. No known exposure to COVID-19 or history of recurrent strep.  Patient has remained afebrile. Past Medical History:  Diagnosis Date  . Cholelithiasis with chronic cholecystitis 06/21/2017  . Closed right ankle fracture 06/15/2012   Overview:  recurrent Formatting of this note might be different from the original. Overview:  recurrent  . Gallstones   . History of attention deficit hyperactivity disorder (ADHD) 06/23/2015  . Medical history non-contributory   . Stress headaches 06/23/2015    Patient Active Problem List   Diagnosis Date Noted  . Encounter for smoking cessation counseling 06/19/2019  . Nasal septal deviation 06/11/2019  . Depression, major, single episode, moderate ([facility]) 03/21/2019  . Nasal polyp 03/21/2019  . Obesity (BMI 30.0-34.9) 06/23/2015    Past Surgical History:  Procedure Laterality Date  . CESAREAN SECTION N/A 02/11/2017   Procedure: Primary CESAREAN SECTION;  Surgeon: Olivia Mackie, MD;  Location: [facility] BIRTHING SUITES;  Service: Obstetrics;  Laterality: N/A;  EDD: 03/20/17  . CHOLECYSTECTOMY N/A 06/21/2017   Procedure: LAPAROSCOPIC CHOLECYSTECTOMY WITH INTRAOPERATIVE CHOLANGIOGRAM;  Surgeon: Darnell Level, MD;  Location: [facility];  Service: General;  Laterality: N/A;  . NO PAST SURGERIES      OB History    Gravida  1   Para  1   Term      Preterm  1   AB      Living  1     SAB      TAB      Ectopic      Multiple  0   Live Births  1            Home Medications    Prior to Admission medications   Medication Sig Start Date End Date Taking? Authorizing Provider  buPROPion (WELLBUTRIN XL) 150 MG 24 hr tablet Take 1 tablet (150 mg total) by mouth daily. 06/19/19   Freddy Finner, NP  cetirizine (ZYRTEC) 5 MG tablet Take 1 tablet (5 mg total) by mouth daily. 02/20/19   Freddy Finner, NP  famotidine (PEPCID) 20 MG tablet Take 1 tablet (20 mg total) by mouth 2 (two) times daily. 02/20/19   Freddy Finner, NP    Family History Family History  Problem Relation Age of Onset  . Heart disease Mother   . Hypertension Mother 59       PULMONARY ARTERIAL HYPERTENSION  . Mental retardation Maternal Uncle   . Hypertension Father   . Healthy Sister   . Healthy Brother   . Healthy Sister   . Healthy Sister   . Healthy Brother   . Healthy Brother   . Healthy Brother     Social History Social History   Tobacco Use  . Smoking status: Current Every Day Smoker    Years: 3.00    Types: E-cigarettes  . Smokeless tobacco: Never Used  . Tobacco comment: quit 5 years ago  Substance Use Topics  . Alcohol  use: Yes    Comment: occasional  . Drug use: No     Allergies   Penicillins   Review of Systems Review of Systems Pertinent negatives listed in HPI Physical Exam Triage Vital Signs ED Triage Vitals  Enc Vitals Group     BP 07/16/19 1034 116/77     Pulse Rate 07/16/19 1034 83     Resp 07/16/19 1034 18     Temp 07/16/19 1034 98.4 F (36.9 C)     Temp src --      SpO2 07/16/19 1034 95 %     Weight --      Height --      Head Circumference --      Peak Flow --      Pain Score 07/16/19 1032 7     Pain Loc --      Pain Edu? --      Excl. in [place]? --    No data found.  Updated Vital Signs BP 116/77   Pulse 83   Temp 98.4 F (36.9 C)   Resp 18   LMP 07/05/2019   SpO2 95%   Visual Acuity Right Eye Distance:   Left Eye Distance:   Bilateral  Distance:    Right Eye Near:   Left Eye Near:    Bilateral Near:     Physical Exam Vitals reviewed.  Constitutional:      Appearance: She is ill-appearing.  HENT:     Head: Normocephalic.     Right Ear: Tympanic membrane normal.     Left Ear: Tympanic membrane normal.     Nose: Septal deviation, mucosal edema and congestion present.     Right Sinus: Maxillary sinus tenderness present.     Left Sinus: Maxillary sinus tenderness present.     Mouth/Throat:     Mouth: Mucous membranes are dry.     Pharynx: No oropharyngeal exudate or posterior oropharyngeal erythema.  Eyes:     Extraocular Movements: Extraocular movements intact.     Pupils: Pupils are equal, round, and reactive to light.  Cardiovascular:     Rate and Rhythm: Normal rate and regular rhythm.  Pulmonary:     Effort: Pulmonary effort is normal.     Breath sounds: Normal breath sounds and air entry.  Musculoskeletal:     Cervical back: Normal range of motion.  Lymphadenopathy:     Cervical: No cervical adenopathy.  Neurological:     Mental Status: She is alert.  Psychiatric:        Attention and Perception: Attention normal.        Mood and Affect: Mood normal.    UC Treatments / Results  Labs (all labs ordered are listed, but only abnormal results are displayed) Labs Reviewed  CULTURE, GROUP A STREP ([place])  NOVEL CORONAVIRUS, NAA  POCT RAPID STREP A (OFFICE)    EKG   Radiology No results found.  Procedures Procedures (including critical care time)  Medications Ordered in UC Medications - No data to display  Initial Impression / Assessment and Plan / UC Course  I have reviewed the triage vital signs and the nursing notes.  Pertinent labs & imaging results that were available during my care of the patient were reviewed by me and considered in my medical decision making (see chart for details).    Acute pansinusitis, recurrence not specified  Rapid strep negative exam findings are consistent  for acute sinusitis and suspect soreness of throat is related to sinusitis and  postnasal drainage. Covering with Omnicef 300 mg twice daily x 7 days reviewed patient's chart she has tolerated cephalosporins in the past although she has a penicillin allergy.  Cough Likely secondary to postnasal drainage -Prescribed benzonatate 3 times daily as needed  Encounter for laboratory testing for COVID-19 virus  - COVID-19 test pending. Work note provided for 72 hours to allow time for test to result. Patient encouraged to self isolate while test is pending.  Final Clinical Impressions(s) / UC Diagnoses   Final diagnoses:  Acute pansinusitis, recurrence not specified  Cough   Discharge Instructions   None    ED Prescriptions    Medication Sig Dispense Auth. Provider   cefdinir (OMNICEF) 300 MG capsule Take 1 capsule (300 mg total) by mouth 2 (two) times daily for 7 days. 14 capsule Bing Neighbors, FNP   fluconazole (DIFLUCAN) 150 MG tablet Take 1 tablet (150 mg total) by mouth once for 1 dose. Repeat if needed 2 tablet Bing Neighbors, FNP   benzonatate (TESSALON) 100 MG capsule Take 1-2 capsules (100-200 mg total) by mouth 3 (three) times daily as needed for cough. 60 capsule Bing Neighbors, FNP     PDMP not reviewed this encounter.   Bing Neighbors, FNP 07/16/19 1517

## 2019-07-17 LAB — SARS-COV-2, NAA 2 DAY TAT

## 2019-07-17 LAB — NOVEL CORONAVIRUS, NAA: SARS-CoV-2, NAA: NOT DETECTED

## 2019-07-19 LAB — CULTURE, GROUP A STREP (THRC)

## 2019-07-20 ENCOUNTER — Other Ambulatory Visit: Payer: Self-pay

## 2019-07-20 ENCOUNTER — Telehealth (INDEPENDENT_AMBULATORY_CARE_PROVIDER_SITE_OTHER): Payer: BC Managed Care – PPO | Admitting: Family Medicine

## 2019-07-20 ENCOUNTER — Encounter: Payer: Self-pay | Admitting: Family Medicine

## 2019-07-20 VITALS — BP 116/77 | Ht 64.0 in | Wt 189.0 lb

## 2019-07-20 DIAGNOSIS — F321 Major depressive disorder, single episode, moderate: Secondary | ICD-10-CM

## 2019-07-20 DIAGNOSIS — F5105 Insomnia due to other mental disorder: Secondary | ICD-10-CM | POA: Diagnosis not present

## 2019-07-20 DIAGNOSIS — Z716 Tobacco abuse counseling: Secondary | ICD-10-CM

## 2019-07-20 DIAGNOSIS — F99 Mental disorder, not otherwise specified: Secondary | ICD-10-CM

## 2019-07-20 MED ORDER — BUPROPION HCL ER (XL) 300 MG PO TB24: 300.0000 mg | ORAL_TABLET | Freq: Every day | ORAL | 1 refills | Status: DC

## 2019-07-20 MED ORDER — HYDROXYZINE HCL 10 MG PO TABS: 10.0000 mg | ORAL_TABLET | Freq: Every day | ORAL | 0 refills | Status: DC

## 2019-07-20 NOTE — Progress Notes (Signed)
Virtual Visit via Telephone Note   This visit type was conducted due to national recommendations for restrictions regarding the COVID-19 Pandemic (e.g. social distancing) in an effort to limit this patient's exposure and mitigate transmission in our community.  Due to her co-morbid illnesses, this patient is at least at moderate risk for complications without adequate follow up.  This format is felt to be most appropriate for this patient at this time.  The patient did not have access to video technology/had technical difficulties with video requiring transitioning to audio format only (telephone).  All issues noted in this document were discussed and addressed.  No physical exam could be performed with this format.    Evaluation Performed:  Follow-up visit  Date:  07/20/2019   ID:  [name] [name], DOB Jan 25, 1996, MRN [id number]  Patient Location: Home Provider Location: Office  Location of Patient: Home Location of Provider: Telehealth Consent was obtain for visit to be over via telehealth. I verified that I am speaking with the correct person using two identifiers.  PCP:  Freddy Finner, NP   Chief Complaint:  Depression   History of Present Illness:    [name] [name] is a 24 y.o. female with history of depression, recent started on Wellbutrin to help with it and smoking cessation. Reports today to see how she is doing.  Reports that she is okay; can tell improvement of mood, especially if she misses a dose. But not were she wants to be yet. Having trouble staying asleep nightly. Energy level is still very low and has not practiced self care as we talked about.   She does say she is smoking less so medication is helping with that.  The patient does not have symptoms concerning for COVID-19 infection (fever, chills, cough, or new shortness of breath).   Past Medical, Surgical, Social History, Allergies, and Medications have been Reviewed.  Past Medical History:  Diagnosis Date    . Cholelithiasis with chronic cholecystitis 06/21/2017  . Closed right ankle fracture 06/15/2012   Overview:  recurrent Formatting of this note might be different from the original. Overview:  recurrent  . Gallstones   . History of attention deficit hyperactivity disorder (ADHD) 06/23/2015  . Medical history non-contributory   . Stress headaches 06/23/2015   Past Surgical History:  Procedure Laterality Date  . CESAREAN SECTION N/A 02/11/2017   Procedure: Primary CESAREAN SECTION;  Surgeon: Olivia Mackie, MD;  Location: [facility] BIRTHING SUITES;  Service: Obstetrics;  Laterality: N/A;  EDD: 03/20/17  . CHOLECYSTECTOMY N/A 06/21/2017   Procedure: LAPAROSCOPIC CHOLECYSTECTOMY WITH INTRAOPERATIVE CHOLANGIOGRAM;  Surgeon: Darnell Level, MD;  Location: [facility] ORS;  Service: General;  Laterality: N/A;  . NO PAST SURGERIES       Current Meds  Medication Sig  . benzonatate (TESSALON) 100 MG capsule Take 1-2 capsules (100-200 mg total) by mouth 3 (three) times daily as needed for cough.  Marland Kitchen buPROPion (WELLBUTRIN XL) 150 MG 24 hr tablet Take 1 tablet (150 mg total) by mouth daily.  . cefdinir (OMNICEF) 300 MG capsule Take 1 capsule (300 mg total) by mouth 2 (two) times daily for 7 days.  . cetirizine (ZYRTEC) 5 MG tablet Take 1 tablet (5 mg total) by mouth daily.  . famotidine (PEPCID) 20 MG tablet Take 1 tablet (20 mg total) by mouth 2 (two) times daily.     Allergies:   Penicillins   ROS:   Please see the history of present illness.    All  other systems reviewed and are negative.   Labs/Other Tests and Data Reviewed:    Recent Labs: No results found for requested labs within last 8760 hours.   Recent Lipid Panel Lab Results  Component Value Date/Time   CHOL 159 07/04/2018 10:32 AM   TRIG 79 07/04/2018 10:32 AM   HDL 40 07/04/2018 10:32 AM   CHOLHDL 4.0 07/04/2018 10:32 AM   LDLCALC 103 (H) 07/04/2018 10:32 AM    Wt Readings from Last 3 Encounters:  07/20/19 189 lb (85.7 kg)  06/19/19 187  lb (84.8 kg)  02/20/19 189 lb (85.7 kg)     Objective:    Vital Signs:  BP 116/77   Ht 5\' 4"  (1.626 m)   Wt 189 lb (85.7 kg)   LMP 07/05/2019   BMI 32.44 kg/m    VITAL SIGNS:  reviewed GEN:  alert and oriented  EYES:  sclerae anicteric, EOMI - Extraocular Movements Intact RESPIRATORY:  no shortness of breath in conversation SKIN:  no rash, lesions or ulcers. MUSCULOSKELETAL:  no obvious deformities. NEURO:  alert and oriented x 3, no obvious focal deficit PSYCH:  Flat affect and depressed mood    Depression screen [facility] 2/9 07/20/2019 06/19/2019 06/19/2019  Decreased Interest 2 0 0  Down, Depressed, Hopeless - 0 0  PHQ - 2 Score 2 0 0  Altered sleeping 2 0 -  Tired, decreased energy 3 0 -  Change in appetite 3 0 -  Feeling bad or failure about yourself  3 0 -  Trouble concentrating 1 0 -  Moving slowly or fidgety/restless 0 0 -  Suicidal thoughts 0 - -  PHQ-9 Score 14 0 -  Difficult doing work/chores Somewhat difficult Not difficult at all -   GAD 7 : Generalized Anxiety Score 07/20/2019  Nervous, Anxious, on Edge 3  Control/stop worrying 2  Worry too much - different things 3  Trouble relaxing 3  Restless 2  Easily annoyed or irritable 3  Afraid - awful might happen 3  Total GAD 7 Score 19      ASSESSMENT & PLAN:     1. Depression, major, single episode, moderate (HCC)  - buPROPion (WELLBUTRIN XL) 300 MG 24 hr tablet; Take 1 tablet (300 mg total) by mouth daily.  Dispense: 30 tablet; Refill: 1 - hydrOXYzine (ATARAX/VISTARIL) 10 MG tablet; Take 1 tablet (10 mg total) by mouth at bedtime.  Dispense: 30 tablet; Refill: 0  2. Encounter for smoking cessation counseling  - buPROPion (WELLBUTRIN XL) 300 MG 24 hr tablet; Take 1 tablet (300 mg total) by mouth daily.  Dispense: 30 tablet; Refill: 1  3. Insomnia due to other mental disorder    Time:   Today, I have spent 20 minutes with the patient with telehealth technology discussing the above problems.      Medication Adjustments/Labs and Tests Ordered: Current medicines are reviewed at length with the patient today.  Concerns regarding medicines are outlined above.   Tests Ordered: No orders of the defined types were placed in this encounter.   Medication Changes: No orders of the defined types were placed in this encounter.   Disposition:  Follow up 2 weeks  Signed, Perlie Mayo, NP  07/20/2019 8:46 AM     [facility]

## 2019-07-20 NOTE — Patient Instructions (Addendum)
I appreciate the opportunity to provide you with care for your health and wellness. Today we discussed: depression and anxiety   Follow up: 2 weeks mychart  No labs or referrals today  MED CHANGE:  Increase Wellbutrin to 300 mg daily Start Atarax at bedtime.  5-10 mins daily for youself, sit somewhere and listen music or watch a funny video on  YouTube (animals are funny at times to watch).  Please continue to practice social distancing to keep you, your family, and our community safe.  If you must go out, please wear a mask and practice good handwashing.  It was a pleasure to see you and I look forward to continuing to work together on your health and well-being. Please do not hesitate to call the office if you need care or have questions about your care.  Have a wonderful day and week. With Gratitude, Tereasa Coop, DNP, AGNP-BC

## 2019-07-20 NOTE — Assessment & Plan Note (Signed)
Starting atarax nightly low dose due to depression waking her up in the middle of the night.  Reviewed side effects, risks and benefits of medication.   Patient acknowledged agreement and understanding of the plan.

## 2019-07-20 NOTE — Assessment & Plan Note (Addendum)
Elevated PHQ-continue Wellbutrin at increased dose. Do not want to change to another med as she reports it is helping her smoking cessation efforts.  Denies SI and HI. Flat affect in communication. Start medication for sleep: atarax  If no improvement seen, therapy referral might be best.   Reviewed side effects, risks and benefits of medication.   Patient acknowledged agreement and understanding of the plan.

## 2019-08-08 ENCOUNTER — Other Ambulatory Visit: Payer: Self-pay

## 2019-08-08 ENCOUNTER — Encounter: Payer: BC Managed Care – PPO | Admitting: Family Medicine

## 2019-08-08 ENCOUNTER — Encounter: Payer: Self-pay | Admitting: Family Medicine

## 2019-08-08 MED ORDER — BUPROPION HCL ER (XL) 300 MG PO TB24: 300.0000 mg | ORAL_TABLET | Freq: Every day | ORAL | 1 refills | Status: DC

## 2019-08-09 NOTE — Progress Notes (Signed)
  This encounter was created in error - please disregard.  Pt never return call for phone visit, did speak with nurse though for check in.

## 2019-10-22 ENCOUNTER — Other Ambulatory Visit: Payer: BC Managed Care – PPO

## 2019-12-20 ENCOUNTER — Encounter: Payer: BC Managed Care – PPO | Admitting: Family Medicine

## 2019-12-21 ENCOUNTER — Other Ambulatory Visit: Payer: Self-pay

## 2019-12-21 ENCOUNTER — Other Ambulatory Visit: Payer: Self-pay | Admitting: *Deleted

## 2019-12-21 ENCOUNTER — Telehealth: Payer: Self-pay | Admitting: Family Medicine

## 2019-12-21 DIAGNOSIS — F321 Major depressive disorder, single episode, moderate: Secondary | ICD-10-CM

## 2019-12-21 DIAGNOSIS — Z716 Tobacco abuse counseling: Secondary | ICD-10-CM

## 2019-12-21 DIAGNOSIS — J302 Other seasonal allergic rhinitis: Secondary | ICD-10-CM

## 2019-12-21 DIAGNOSIS — R0981 Nasal congestion: Secondary | ICD-10-CM

## 2019-12-21 MED ORDER — HYDROXYZINE HCL 10 MG PO TABS: 10.0000 mg | ORAL_TABLET | Freq: Every day | ORAL | 1 refills | Status: DC

## 2019-12-21 MED ORDER — BUPROPION HCL ER (XL) 300 MG PO TB24: 300.0000 mg | ORAL_TABLET | Freq: Every day | ORAL | 1 refills | Status: DC

## 2019-12-21 MED ORDER — CETIRIZINE HCL 5 MG PO TABS: 5.0000 mg | ORAL_TABLET | Freq: Every day | ORAL | 1 refills | Status: DC

## 2019-12-21 NOTE — Telephone Encounter (Signed)
I do not manage them, she can have a referral to sleep study provider.

## 2019-12-21 NOTE — Telephone Encounter (Signed)
Patient requested a call she has a question about her medication [phone]

## 2019-12-21 NOTE — Telephone Encounter (Signed)
Pt is going to call ENT as she thinks this may have been a recommendation of him if it wasn't she will call back so that we can place this referral

## 2019-12-21 NOTE — Telephone Encounter (Signed)
Wants to know if you could go ahead and order her cpap machine

## 2020-01-24 ENCOUNTER — Telehealth: Payer: Self-pay | Admitting: Family Medicine

## 2020-01-24 NOTE — Telephone Encounter (Signed)
Pt informed. She has not been Dx officially. She is going to call the Covid clinic to get an appt set up.

## 2020-01-24 NOTE — Telephone Encounter (Signed)
It is a lot of symptom management. Tylenol or Aleve for headache and body aches. Cough meds might be needed if cough is bad, she might need prednisone if it was not prescribed when she was Dx. Where was she Dx and did she have an appt? She needs to increase fluids and rest.

## 2020-01-24 NOTE — Telephone Encounter (Signed)
Patient calling states she and everyone in her house has tested positive for covid she has had a really bad headache and high fevers since last night and can barely move today is there anything we could recommend for her ? P# [phone]

## 2020-01-24 NOTE — Telephone Encounter (Signed)
Pt says that everyone in her house currently has Covid. She started with a fever last night, the highest it got was 103.4. Today she is having chills and a headache. No shortness of breath. Some cough. Wants to know if there is anything you advise her take?

## 2020-03-18 ENCOUNTER — Other Ambulatory Visit: Payer: Self-pay

## 2020-03-18 ENCOUNTER — Ambulatory Visit (INDEPENDENT_AMBULATORY_CARE_PROVIDER_SITE_OTHER): Payer: Self-pay | Admitting: Family Medicine

## 2020-03-18 ENCOUNTER — Encounter: Payer: Self-pay | Admitting: Family Medicine

## 2020-03-18 VITALS — BP 114/72 | HR 90 | Temp 97.0°F | Ht 64.0 in | Wt 191.0 lb

## 2020-03-18 DIAGNOSIS — Z720 Tobacco use: Secondary | ICD-10-CM

## 2020-03-18 DIAGNOSIS — F332 Major depressive disorder, recurrent severe without psychotic features: Secondary | ICD-10-CM

## 2020-03-18 MED ORDER — BUPROPION HCL ER (XL) 150 MG PO TB24: ORAL_TABLET | ORAL | 0 refills | Status: DC

## 2020-03-18 NOTE — Patient Instructions (Signed)
  I appreciate the opportunity to provide you with care for your health and wellness. Today we discussed: depression/adhd/bipolar  Follow up: 1 month  No labs  Referrals today: Psychiatry for testing and eval   Restart Wellbutrin 150 mg x 14 days then increase to 300 mg   Remember to focus on self care when you are feeling stressed Walking is a great way to reset.   Please continue to practice social distancing to keep you, your family, and our community safe.  If you must go out, please wear a mask and practice good handwashing.  It was a pleasure to see you and I look forward to continuing to work together on your health and well-being. Please do not hesitate to call the office if you need care or have questions about your care.  Have a wonderful day. With Gratitude, Tereasa Coop, DNP, AGNP-BC

## 2020-03-18 NOTE — Progress Notes (Signed)
Subjective:  Patient ID: [name] [name], female    DOB: [birth year]  Age: 24 y.o. MRN: [id number]  CC:  Chief Complaint  Patient presents with   Depression    Wants to start back on medication.      HPI  HPI [name] [name] is a 24 y.o. female with history of depression. She was started on Wellbutrin earlier in the year to help with it and smoking cessation and mood. Today she reports she stopped taking it about 3 months ago.  Reports that she was okay; but can tell mood has changed alot. She describes a cycle of depression and energy every 2 weeks or so will.  She takes or starts projects and then within 2 weeks she has no desire for them. She thinks she has a family history of bipolar d/o and she has a personal hx of adhd. She is willing to have treating for proper treatment. She also has noticed increase in her e cigarette smoking since stopping wellbutrin.  Today patient denies signs and symptoms of COVID 19 infection including fever, chills, cough, shortness of breath, and headache. Past Medical, Surgical, Social History, Allergies, and Medications have been Reviewed.   Past Medical History:  Diagnosis Date   Cholelithiasis with chronic cholecystitis 06/21/2017   Closed right ankle fracture 06/15/2012   Overview:  recurrent Formatting of this note might be different from the original. Overview:  recurrent   Gallstones    History of attention deficit hyperactivity disorder (ADHD) 06/23/2015   Medical history non-contributory    Stress headaches 06/23/2015    No outpatient medications have been marked as taking for the 03/18/20 encounter (Office Visit) with Freddy Finner, NP.    ROS:  Review of Systems  HENT: Negative.   Eyes: Negative.   Respiratory: Negative.   Cardiovascular: Negative.   Gastrointestinal: Negative.   Genitourinary: Negative.   Musculoskeletal: Negative.   Skin: Negative.   Neurological: Negative.   Endo/Heme/Allergies: Negative.    Psychiatric/Behavioral: Positive for depression. The patient is nervous/anxious.      Objective:   Today's Vitals: BP 114/72 (BP Location: Right Arm, Patient Position: Sitting, Cuff Size: Normal)    Pulse 90    Temp (!) 97 F (36.1 C) (Temporal)    Ht 5\' 4"  (1.626 m)    Wt 191 lb (86.6 kg)    LMP 03/04/2020    SpO2 100%    BMI 32.79 kg/m  Vitals with BMI 03/18/2020 08/08/2019 07/20/2019  Height 5\' 4"  5\' 4"  5\' 4"   Weight 191 lbs 189 lbs 189 lbs  BMI 32.77 32.43 32.43  Systolic 114 116 07/22/2019  Diastolic 72 77 77  Pulse 90 - -     Physical Exam Vitals and nursing note reviewed.  Constitutional:      Appearance: Normal appearance. She is well-developed and well-groomed. She is obese.  HENT:     Head: Normocephalic and atraumatic.     Right Ear: External ear normal.     Left Ear: External ear normal.     Mouth/Throat:     Comments: Mask in place  Eyes:     General:        Right eye: No discharge.        Left eye: No discharge.     Conjunctiva/sclera: Conjunctivae normal.  Cardiovascular:     Rate and Rhythm: Normal rate and regular rhythm.     Pulses: Normal pulses.     Heart sounds: Normal heart sounds.  Pulmonary:     Effort: Pulmonary effort is normal.     Breath sounds: Normal breath sounds.  Musculoskeletal:        General: Normal range of motion.     Cervical back: Normal range of motion and neck supple.  Skin:    General: Skin is warm.  Neurological:     General: No focal deficit present.     Mental Status: She is alert and oriented to person, place, and time.  Psychiatric:        Attention and Perception: Attention normal.        Mood and Affect: Mood normal.        Speech: Speech normal.        Behavior: Behavior normal. Behavior is cooperative.        Thought Content: Thought content normal.        Cognition and Memory: Cognition normal.        Judgment: Judgment normal.     Assessment   1. Severe episode of recurrent major depressive disorder, without  psychotic features (HCC)   2. Nicotine abuse     Tests ordered Orders Placed This Encounter  Procedures   Ambulatory referral to Psychiatry     Plan: Please see assessment and plan per problem list above.   Meds ordered this encounter  Medications   buPROPion (WELLBUTRIN XL) 150 MG 24 hr tablet    Sig: Take 1 tablet (150 mg total) by mouth daily for 14 days, THEN 2 tablets (300 mg total) daily for 14 days.    Dispense:  42 tablet    Refill:  0    Order Specific Question:   Supervising Provider    Answer:   Kerri Perches [[id number]]    Patient to follow-up in 1 month .   Freddy Finner, NP

## 2020-03-18 NOTE — Assessment & Plan Note (Signed)
PHQ elevated, Denies SI and HI Wellbutrin to be restarted advised to not stop in future without speaking to provider. Close f.u 4 weeks

## 2020-03-18 NOTE — Assessment & Plan Note (Signed)
Asked about quitting: confirms they are currently smokes cigarettes Advise to quit smoking: Educated about QUITTING to reduce the risk of cancer, cardio and cerebrovascular disease. Assess willingness: Unwilling to quit at this time, but is working on cutting back. Assist with counseling and pharmacotherapy: Counseled for 5 minutes and literature provided. Arrange for follow up: work to quitting follow up in 3 months and continue to offer help.   Restarting wellbutrin.

## 2020-04-02 ENCOUNTER — Other Ambulatory Visit: Payer: Self-pay

## 2020-04-02 ENCOUNTER — Encounter: Payer: Self-pay | Admitting: Family Medicine

## 2020-04-02 ENCOUNTER — Telehealth (INDEPENDENT_AMBULATORY_CARE_PROVIDER_SITE_OTHER): Payer: Self-pay | Admitting: Family Medicine

## 2020-04-02 VITALS — Ht 64.0 in | Wt 191.0 lb

## 2020-04-02 DIAGNOSIS — J0140 Acute pansinusitis, unspecified: Secondary | ICD-10-CM

## 2020-04-02 DIAGNOSIS — R058 Other specified cough: Secondary | ICD-10-CM

## 2020-04-02 MED ORDER — ALBUTEROL SULFATE HFA 108 (90 BASE) MCG/ACT IN AERS: 1.0000 | INHALATION_SPRAY | Freq: Four times a day (QID) | RESPIRATORY_TRACT | 0 refills | Status: AC | PRN

## 2020-04-02 MED ORDER — AZITHROMYCIN 250 MG PO TABS: ORAL_TABLET | ORAL | 0 refills | Status: DC

## 2020-04-02 MED ORDER — PROMETHAZINE-DM 6.25-15 MG/5ML PO SYRP: 2.5000 mL | ORAL_SOLUTION | Freq: Four times a day (QID) | ORAL | 0 refills | Status: DC | PRN

## 2020-04-02 NOTE — Progress Notes (Signed)
Virtual Visit via Telephone Note   This visit type was conducted due to national recommendations for restrictions regarding the COVID-19 Pandemic (e.g. social distancing) in an effort to limit this patient's exposure and mitigate transmission in our community.  Due to her co-morbid illnesses, this patient is at least at moderate risk for complications without adequate follow up.  This format is felt to be most appropriate for this patient at this time.  The patient did not have access to video technology/had technical difficulties with video requiring transitioning to audio format only (telephone).  All issues noted in this document were discussed and addressed.  No physical exam could be performed with this format.   Evaluation Performed:  Follow-up visit  Date:  04/02/2020   ID:  [name] [name], DOB 07-05-1995, MRN [id number]  Patient Location: Home Provider Location: Office/Clinic   Participants: Nurse for intake and work up; Patient and Provider for Visit and Wrap up  Method of visit: Telephone  Location of Patient: Home Location of Provider: Office Consent was obtain for visit over the telephone. Services rendered by provider: Visit was performed via telephone  I verified that I am speaking with the correct person using two identifiers.  PCP:  Freddy Finner, NP   Chief Complaint:  Cold signs  History of Present Illness:    [name] [name] is a 24 y.o. female with a five day onset of cold signs and symptoms. Started having sore throat and then developed into body aches, cough- productive- thick mucus a little blood tinge.  Has tried sinus max- cold and flu OTC. COVID test negative- two days ago.  Reports shortness of breath. Denies facial pain, no chest pain. Some back pain on the right side.  No itchy watery eyes, fevers or chills, nausea.   The patient does not have symptoms concerning for COVID-19 infection (fever, chills, cough, or new shortness of breath).   Past  Medical, Surgical, Social History, Allergies, and Medications have been Reviewed.  Past Medical History:  Diagnosis Date  . Cholelithiasis with chronic cholecystitis 06/21/2017  . Closed right ankle fracture 06/15/2012   Overview:  recurrent Formatting of this note might be different from the original. Overview:  recurrent  . Gallstones   . History of attention deficit hyperactivity disorder (ADHD) 06/23/2015  . Medical history non-contributory   . Stress headaches 06/23/2015   Past Surgical History:  Procedure Laterality Date  . CESAREAN SECTION N/A 02/11/2017   Procedure: Primary CESAREAN SECTION;  Surgeon: Olivia Mackie, MD;  Location: [facility] BIRTHING SUITES;  Service: Obstetrics;  Laterality: N/A;  EDD: 03/20/17  . CHOLECYSTECTOMY N/A 06/21/2017   Procedure: LAPAROSCOPIC CHOLECYSTECTOMY WITH INTRAOPERATIVE CHOLANGIOGRAM;  Surgeon: Darnell Level, MD;  Location: [facility] ORS;  Service: General;  Laterality: N/A;  . NO PAST SURGERIES       Current Meds  Medication Sig  . buPROPion (WELLBUTRIN XL) 150 MG 24 hr tablet Take 1 tablet (150 mg total) by mouth daily for 14 days, THEN 2 tablets (300 mg total) daily for 14 days.     Allergies:   Penicillins   ROS:   Please see the history of present illness.    All other systems reviewed and are negative.   Labs/Other Tests and Data Reviewed:    Recent Labs: No results found for requested labs within last 8760 hours.   Recent Lipid Panel Lab Results  Component Value Date/Time   CHOL 159 07/04/2018 10:32 AM   TRIG 79 07/04/2018 10:32  AM   HDL 40 07/04/2018 10:32 AM   CHOLHDL 4.0 07/04/2018 10:32 AM   LDLCALC 103 (H) 07/04/2018 10:32 AM    Wt Readings from Last 3 Encounters:  04/02/20 191 lb (86.6 kg)  03/18/20 191 lb (86.6 kg)  08/08/19 189 lb (85.7 kg)     Objective:    Vital Signs:  Ht 5\' 4"  (1.626 m)   Wt 191 lb (86.6 kg)   LMP 03/04/2020   BMI 32.79 kg/m    VITAL SIGNS:  reviewed GEN:  no acute distress RESPIRATORY:  no  shortness of breath in conversation, but voice is hoarse PSYCH:  normal affect  ASSESSMENT & PLAN:    1. Acute non-recurrent pansinusitis  - azithromycin (ZITHROMAX) 250 MG tablet; Take 2 tablets (500 mg) on the first day. Then take 1 tablet (250mg ) on days 2-5.  Dispense: 6 tablet; Refill: 0  2. Cough with sputum  - promethazine-dextromethorphan (PROMETHAZINE-DM) 6.25-15 MG/5ML syrup; Take 2.5 mLs by mouth 4 (four) times daily as needed for cough.  Dispense: 118 mL; Refill: 0 - albuterol (VENTOLIN HFA) 108 (90 Base) MCG/ACT inhaler; Inhale 1-2 puffs into the lungs every 6 (six) hours as needed for wheezing or shortness of breath.  Dispense: 8 g; Refill: 0 - azithromycin (ZITHROMAX) 250 MG tablet; Take 2 tablets (500 mg) on the first day. Then take 1 tablet (250mg ) on days 2-5.  Dispense: 6 tablet; Refill: 0    Time:   Today, I have spent 7 minutes with the patient with telehealth technology discussing the above problems.     Medication Adjustments/Labs and Tests Ordered: Current medicines are reviewed at length with the patient today.  Concerns regarding medicines are outlined above.   Tests Ordered: No orders of the defined types were placed in this encounter.   Medication Changes: No orders of the defined types were placed in this encounter.    Disposition:  Follow up 04/18/2020 Signed, 06-07-1992, NP  04/02/2020 8:54 AM     04/20/2020 [facility]

## 2020-04-02 NOTE — Assessment & Plan Note (Signed)
Cough syrup provided 

## 2020-04-02 NOTE — Patient Instructions (Signed)
No printing of AVS  Follow up as scheduled.

## 2020-04-02 NOTE — Assessment & Plan Note (Signed)
S&S consistent with the onset of sinus infection. COVID neg Zpak given due to holiday and office being close and her not being able to call back if not better. Cough syrup provided as well.

## 2020-04-16 ENCOUNTER — Other Ambulatory Visit: Payer: Self-pay

## 2020-04-16 ENCOUNTER — Encounter: Payer: Self-pay | Admitting: Family Medicine

## 2020-04-16 ENCOUNTER — Telehealth (INDEPENDENT_AMBULATORY_CARE_PROVIDER_SITE_OTHER): Payer: Self-pay | Admitting: Family Medicine

## 2020-04-16 VITALS — Ht 64.0 in | Wt 191.0 lb

## 2020-04-16 DIAGNOSIS — Z720 Tobacco use: Secondary | ICD-10-CM

## 2020-04-16 DIAGNOSIS — F321 Major depressive disorder, single episode, moderate: Secondary | ICD-10-CM

## 2020-04-16 MED ORDER — BUPROPION HCL ER (XL) 300 MG PO TB24: 300.0000 mg | ORAL_TABLET | Freq: Every day | ORAL | 1 refills | Status: DC

## 2020-04-16 NOTE — Progress Notes (Signed)
Virtual Visit via Telephone Note   This visit type was conducted due to national recommendations for restrictions regarding the COVID-19 Pandemic (e.g. social distancing) in an effort to limit this patient's exposure and mitigate transmission in our community.  Due to her co-morbid illnesses, this patient is at least at moderate risk for complications without adequate follow up.  This format is felt to be most appropriate for this patient at this time.  The patient did not have access to video technology/had technical difficulties with video requiring transitioning to audio format only (telephone).  All issues noted in this document were discussed and addressed.  No physical exam could be performed with this format.     Evaluation Performed:  Follow-up visit  Date:  04/16/2020   ID:  [name] [name], DOB [birth year], MRN [id number]  Patient Location: Home Provider Location: Office/Clinic   Participants: Nurse for intake and work up; Patient and Provider for Visit and Wrap up  Method of visit: Telephone  Location of Patient: Home Location of Provider: Office Consent was obtain for visit over the telephone. Services rendered by provider: Visit was performed via telephone\  I verified that I am speaking with the correct person using two identifiers.  PCP:  Freddy Finner, NP   Chief Complaint:  Mood med follow up  History of Present Illness:    [name] [name] is a 25 y.o. female with history of depression. She was started on Wellbutrin earlier in the year of 2021 to help with it and smoking cessation and mood. She had stopped in back in the Fall of 2021. Came in in Dec with mood changes and wanted to try the Wellbutrin again.  Today she reports she is doing much better. Improved moved all around denies having any SI or HI. We will continue 300 mg Wellbutrin XL. Additionally she reports that she is smoking less of her e-cigarette as well  The patient does not have symptoms concerning  for COVID-19 infection (fever, chills, cough, or new shortness of breath).   Past Medical, Surgical, Social History, Allergies, and Medications have been Reviewed.  Past Medical History:  Diagnosis Date  . Cholelithiasis with chronic cholecystitis 06/21/2017  . Closed right ankle fracture 06/15/2012   Overview:  recurrent Formatting of this note might be different from the original. Overview:  recurrent  . Gallstones   . History of attention deficit hyperactivity disorder (ADHD) 06/23/2015  . Medical history non-contributory   . Stress headaches 06/23/2015   Past Surgical History:  Procedure Laterality Date  . CESAREAN SECTION N/A 02/11/2017   Procedure: Primary CESAREAN SECTION;  Surgeon: Olivia Mackie, MD;  Location: [facility] BIRTHING SUITES;  Service: Obstetrics;  Laterality: N/A;  EDD: 03/20/17  . CHOLECYSTECTOMY N/A 06/21/2017   Procedure: LAPAROSCOPIC CHOLECYSTECTOMY WITH INTRAOPERATIVE CHOLANGIOGRAM;  Surgeon: Darnell Level, MD;  Location: [facility] ORS;  Service: General;  Laterality: N/A;  . NO PAST SURGERIES       Current Meds  Medication Sig  . albuterol (VENTOLIN HFA) 108 (90 Base) MCG/ACT inhaler Inhale 1-2 puffs into the lungs every 6 (six) hours as needed for wheezing or shortness of breath.  Marland Kitchen azithromycin (ZITHROMAX) 250 MG tablet Take 2 tablets (500 mg) on the first day. Then take 1 tablet (250mg ) on days 2-5.  promethazine-dextromethorphan (PROMETHAZINE-DM) 6.25-15 MG/5ML syrup Take 2.5 mLs by mouth 4 (four) times daily as needed for cough.     Allergies:   Penicillins   ROS:   Please see the  history of present illness.    All other systems reviewed and are negative.   Labs/Other Tests and Data Reviewed:    Recent Labs: No results found for requested labs within last 8760 hours.   Recent Lipid Panel Lab Results  Component Value Date/Time   CHOL 159 07/04/2018 10:32 AM   TRIG 79 07/04/2018 10:32 AM   HDL 40 07/04/2018 10:32 AM   CHOLHDL 4.0 07/04/2018 10:32 AM    LDLCALC 103 (H) 07/04/2018 10:32 AM    Wt Readings from Last 3 Encounters:  04/16/20 191 lb (86.6 kg)  04/02/20 191 lb (86.6 kg)  03/18/20 191 lb (86.6 kg)     Objective:    Vital Signs:  Ht 5\' 4"  (1.626 m)   Wt 191 lb (86.6 kg)   LMP 03/29/2020   BMI 32.79 kg/m    VITAL SIGNS:  reviewed GEN:  no acute distress RESPIRATORY:  no shortness of breath in conversation PSYCH:  normal affect   Depression screen [facility] 2/9 04/16/2020 03/18/2020 08/08/2019 07/20/2019 06/19/2019  Decreased Interest 0 3 2 2  0  Down, Depressed, Hopeless 0 3 0 - 0  PHQ - 2 Score 0 6 2 2  0  Altered sleeping 0 3 2 2  0  Tired, decreased energy 0 3 3 3  0  Change in appetite 1 3 3 3  0  Feeling bad or failure about yourself  0 3 3 3  0  Trouble concentrating 0 3 1 1  0  Moving slowly or fidgety/restless 0 3 0 0 0  Suicidal thoughts 0 0 0 0 -  PHQ-9 Score 1 24 14 14  0  Difficult doing work/chores Not difficult at all Very difficult Somewhat difficult Somewhat difficult Not difficult at all  Some recent data might be hidden  ]   ASSESSMENT & PLAN:    1. Depression, major, single episode, moderate (HCC)  - buPROPion (WELLBUTRIN XL) 300 MG 24 hr tablet; Take 1 tablet (300 mg total) by mouth daily.  Dispense: 90 tablet; Refill: 1   2. Nicotine abuse  - buPROPion (WELLBUTRIN XL) 300 MG 24 hr tablet; Take 1 tablet (300 mg total) by mouth daily.  Dispense: 90 tablet; Refill: 1     Time:   Today, I have spent 7 minutes with the patient with telehealth technology discussing the above problems.     Medication Adjustments/Labs and Tests Ordered: Current medicines are reviewed at length with the patient today.  Concerns regarding medicines are outlined above.   Tests Ordered: No orders of the defined types were placed in this encounter.   Medication Changes: No orders of the defined types were placed in this encounter.    Disposition:  Follow up 6 months  Signed, 06/21/2019, NP  04/16/2020 10:03 AM      [facility]

## 2020-04-16 NOTE — Assessment & Plan Note (Signed)
E cig use  Improved per her  Asked about quitting: confirms they are currently smokes cigarettes Advise to quit smoking: Educated about QUITTING to reduce the risk of cancer, cardio and cerebrovascular disease. Assess willingness: Unwilling to quit at this time, but is working on cutting back. Assist with counseling and pharmacotherapy: Counseled for 5 minutes and literature provided. Arrange for follow up: working on quitting follow up in 3 months and continue to offer help.

## 2020-04-16 NOTE — Assessment & Plan Note (Signed)
PHQ improved- attention and mood improved all around Denies SI and HI Wellbutrin 300 mg 6 month supply provided Will revisit in 6 months to see if she would like to continue it.

## 2020-04-16 NOTE — Patient Instructions (Signed)
  I appreciate the opportunity to provide you with care for your health and wellness.  Follow up: 6 months for mood follow up  No labs or referrals today  Continue medication as ordered- if you need anything before next appt just let us know.  Please continue to practice social distancing to keep you, your family, and our community safe.  If you must go out, please wear a mask and practice good handwashing.  It was a pleasure to see you and I look forward to continuing to work together on your health and well-being. Please do not hesitate to call the office if you need care or have questions about your care.  Have a wonderful day. With Gratitude, Tereasa Coop, DNP, AGNP-BC

## 2020-04-18 ENCOUNTER — Telehealth: Payer: Medicaid Other | Admitting: Family Medicine

## 2020-07-23 ENCOUNTER — Other Ambulatory Visit: Payer: Medicaid Other

## 2020-07-24 ENCOUNTER — Ambulatory Visit: Payer: Medicaid Other | Admitting: Internal Medicine

## 2020-10-14 ENCOUNTER — Ambulatory Visit: Payer: Medicaid Other | Admitting: Family Medicine

## 2020-11-07 ENCOUNTER — Ambulatory Visit: Payer: Medicaid Other | Admitting: Internal Medicine

## 2020-11-07 ENCOUNTER — Ambulatory Visit: Payer: Medicaid Other | Admitting: Family Medicine

## 2020-11-27 ENCOUNTER — Ambulatory Visit: Payer: No Typology Code available for payment source | Admitting: Internal Medicine

## 2020-11-27 ENCOUNTER — Encounter: Payer: Self-pay | Admitting: Internal Medicine

## 2020-11-27 ENCOUNTER — Other Ambulatory Visit: Payer: Self-pay

## 2020-11-27 VITALS — BP 125/84 | HR 74 | Temp 98.6°F | Resp 18 | Ht 64.0 in | Wt 189.0 lb

## 2020-11-27 DIAGNOSIS — F321 Major depressive disorder, single episode, moderate: Secondary | ICD-10-CM

## 2020-11-27 DIAGNOSIS — Z124 Encounter for screening for malignant neoplasm of cervix: Secondary | ICD-10-CM

## 2020-11-27 DIAGNOSIS — Z720 Tobacco use: Secondary | ICD-10-CM

## 2020-11-27 DIAGNOSIS — Z0001 Encounter for general adult medical examination with abnormal findings: Secondary | ICD-10-CM

## 2020-11-27 DIAGNOSIS — G4700 Insomnia, unspecified: Secondary | ICD-10-CM

## 2020-11-27 DIAGNOSIS — Z0000 Encounter for general adult medical examination without abnormal findings: Secondary | ICD-10-CM

## 2020-11-27 MED ORDER — HYDROXYZINE PAMOATE 25 MG PO CAPS: 25.0000 mg | ORAL_CAPSULE | Freq: Every evening | ORAL | 0 refills | Status: DC | PRN

## 2020-11-27 MED ORDER — BUPROPION HCL ER (XL) 300 MG PO TB24: 300.0000 mg | ORAL_TABLET | Freq: Every day | ORAL | 1 refills | Status: AC

## 2020-11-27 NOTE — Assessment & Plan Note (Signed)
Flowsheet Row Office Visit from 11/27/2020 in [facility]  PHQ-9 Total Score 15     Needs to be compliant with Wellbutrin Vistaril PRN for anxiety/insomnia

## 2020-11-27 NOTE — Assessment & Plan Note (Signed)
Quit smoking in 10/2020.

## 2020-11-27 NOTE — Progress Notes (Signed)
Established Patient Office Visit  Subjective:  Patient ID: [name] [name], female    DOB: June 11, 1995  Age: 25 y.o. MRN: [id number]  CC:  Chief Complaint  Patient presents with   Depression    HPI [name] [name] is a 25 year old female with PMH of depression and tobacco abuse who presents for follow up of depression.  She has been doing better lately. Reports that she had tapered her dose of Wellbutrin as she was feeling better, but then started having anxiety and anger spells. She has started Wellbutrin 300 mg QD now and has been feeling better now. She has c/o insomnia. Denies any SI or HI.  Quit smoking in 10/2020.  Past Medical History:  Diagnosis Date   Cholelithiasis with chronic cholecystitis 06/21/2017   Closed right ankle fracture 06/15/2012   Overview:  recurrent Formatting of this note might be different from the original. Overview:  recurrent   Gallstones    History of attention deficit hyperactivity disorder (ADHD) 06/23/2015   Medical history non-contributory    Stress headaches 06/23/2015    Past Surgical History:  Procedure Laterality Date   CESAREAN SECTION N/A 02/11/2017   Procedure: Primary CESAREAN SECTION;  Surgeon: Olivia Mackie, MD;  Location: [facility] BIRTHING SUITES;  Service: Obstetrics;  Laterality: N/A;  EDD: 03/20/17   CHOLECYSTECTOMY N/A 06/21/2017   Procedure: LAPAROSCOPIC CHOLECYSTECTOMY WITH INTRAOPERATIVE CHOLANGIOGRAM;  Surgeon: Darnell Level, MD;  Location: [facility] ORS;  Service: General;  Laterality: N/A;   NO PAST SURGERIES      Family History  Problem Relation Age of Onset   Heart disease Mother    Hypertension Mother 26       PULMONARY ARTERIAL HYPERTENSION   Mental retardation Maternal Uncle    Hypertension Father    Healthy Sister    Healthy Brother    Healthy Sister    Healthy Sister    Healthy Brother    Healthy Brother    Healthy Brother     Social History   Socioeconomic History   Marital status: Married    Spouse name: Ames Coupe   Number of children: 1   Years of education: 12   Highest education level: 12th grade  Occupational History    Comment: [employer]   Tobacco Use   Smoking status: Every Day    Types: E-cigarettes   Smokeless tobacco: Never   Tobacco comments:    quit 5 years ago  Vaping Use   Vaping Use: Never used  Substance and Sexual Activity   Alcohol use: Yes    Comment: occasional   Drug use: No   Sexual activity: Yes    Birth control/protection: None  Other Topics Concern   Not on file  Social History Narrative   Lives with husband Dillion    And [place] 2 in Nov   Two dogs: Coop and [place]      Caffeine: 1 coffee a day   Drinks [employer] all foods groups: not a lot of red meat   Working to eat healthy and exercise daily.      Wears seatbelt, sunscreen      Smoke detectors good batteries          Social Determinants of Health   Financial Resource Strain: Not on file  Food Insecurity: Not on file  Transportation Needs: Not on file  Physical Activity: Not on file  Stress: Not on file  Social Connections: Not on file  Intimate Partner Violence: Not on  file    Outpatient Medications Prior to Visit  Medication Sig Dispense Refill   albuterol (VENTOLIN HFA) 108 (90 Base) MCG/ACT inhaler Inhale 1-2 puffs into the lungs every 6 (six) hours as needed for wheezing or shortness of breath. 8 g 0   buPROPion (WELLBUTRIN XL) 300 MG 24 hr tablet Take 1 tablet (300 mg total) by mouth daily. 90 tablet 1   No facility-administered medications prior to visit.    Allergies  Allergen Reactions   Penicillins Hives    Did it involve swelling of the face/tongue/throat, SOB, or low BP? No Did it involve sudden or severe rash/hives, skin peeling, or any reaction on the inside of your mouth or nose? No Did you need to seek medical attention at a hospital or doctor's office? Unknown-PROBABLY When did it last happen? Childhood reaction type    If all above answers are "NO",  may proceed with cephalosporin use.     ROS Review of Systems  Constitutional:  Negative for chills and fever.  HENT:  Negative for congestion, sinus pressure, sinus pain and sore throat.   Eyes:  Negative for pain and discharge.  Respiratory:  Negative for cough and shortness of breath.   Cardiovascular:  Negative for chest pain and palpitations.  Gastrointestinal:  Negative for abdominal pain, constipation, diarrhea, nausea and vomiting.  Endocrine: Negative for polydipsia and polyuria.  Genitourinary:  Negative for dysuria and hematuria.  Musculoskeletal:  Negative for neck pain and neck stiffness.  Skin:  Negative for rash.  Neurological:  Negative for dizziness and weakness.  Psychiatric/Behavioral:  Positive for dysphoric mood and sleep disturbance. Negative for agitation and behavioral problems. The patient is nervous/anxious.      Objective:    Physical Exam Vitals reviewed.  Constitutional:      General: She is not in acute distress.    Appearance: She is not diaphoretic.  HENT:     Head: Normocephalic and atraumatic.     Nose: Nose normal. No congestion.     Mouth/Throat:     Mouth: Mucous membranes are moist.     Pharynx: No posterior oropharyngeal erythema.  Eyes:     General: No scleral icterus.    Extraocular Movements: Extraocular movements intact.  Cardiovascular:     Rate and Rhythm: Normal rate and regular rhythm.     Pulses: Normal pulses.     Heart sounds: Normal heart sounds. No murmur heard. Pulmonary:     Breath sounds: Normal breath sounds. No wheezing or rales.  Musculoskeletal:     Cervical back: Neck supple. No tenderness.     Right lower leg: No edema.     Left lower leg: No edema.  Skin:    General: Skin is warm.     Findings: No rash.  Neurological:     General: No focal deficit present.     Mental Status: She is alert and oriented to person, place, and time.     Sensory: No sensory deficit.     Motor: No weakness.  Psychiatric:         Mood and Affect: Mood normal.        Behavior: Behavior normal.    BP 125/84 (BP Location: Right Arm, Patient Position: Sitting, Cuff Size: Large)   Pulse 74   Temp 98.6 F (37 C)   Resp 18   Ht 5\' 4"  (1.626 m)   Wt 189 lb (85.7 kg)   SpO2 98%   BMI 32.44 kg/m  Wt Readings from  Last 3 Encounters:  11/27/20 189 lb (85.7 kg)  04/16/20 191 lb (86.6 kg)  04/02/20 191 lb (86.6 kg)     Health Maintenance Due  Topic Date Due   COVID-19 Vaccine (1) Never done   Pneumococcal Vaccine 51-29 Years old (1 - PCV) Never done   PAP-Cervical Cytology Screening  05/20/2019   PAP SMEAR-Modifier  05/20/2019   INFLUENZA VACCINE  11/03/2020    There are no preventive care reminders to display for this patient.  Lab Results  Component Value Date   TSH 1.570 07/04/2018   Lab Results  Component Value Date   WBC 8.0 07/04/2018   HGB 13.0 07/04/2018   HCT 39.4 07/04/2018   MCV 87 07/04/2018   PLT 351 07/04/2018   Lab Results  Component Value Date   NA 138 07/04/2018   K 4.7 07/04/2018   CO2 22 07/04/2018   GLUCOSE 72 07/04/2018   BUN 18 07/04/2018   CREATININE 0.74 07/04/2018   BILITOT 0.3 07/04/2018   ALKPHOS 62 07/04/2018   AST 11 07/04/2018   ALT 13 07/04/2018   PROT 7.2 07/04/2018   ALBUMIN 4.6 07/04/2018   CALCIUM 9.5 07/04/2018   ANIONGAP 8 06/20/2017   Lab Results  Component Value Date   CHOL 159 07/04/2018   Lab Results  Component Value Date   HDL 40 07/04/2018   Lab Results  Component Value Date   LDLCALC 103 (H) 07/04/2018   Lab Results  Component Value Date   TRIG 79 07/04/2018   Lab Results  Component Value Date   CHOLHDL 4.0 07/04/2018   No results found for: HGBA1C    Assessment & Plan:   Problem List Items Addressed This Visit       Other   Depression, major, single episode, moderate (HCC) - Primary    Flowsheet Row Office Visit from 11/27/2020 in [facility]  PHQ-9 Total Score 15  Needs to be compliant with  Wellbutrin Vistaril PRN for anxiety/insomnia      Relevant Medications   buPROPion (WELLBUTRIN XL) 300 MG 24 hr tablet   hydrOXYzine (VISTARIL) 25 MG capsule   Nicotine abuse    Quit smoking in 10/2020.      Relevant Medications   buPROPion (WELLBUTRIN XL) 300 MG 24 hr tablet   Other Visit Diagnoses     Screening for cervical cancer       Relevant Orders   Ambulatory referral to Obstetrics / Gynecology   Insomnia, unspecified type       Relevant Medications   hydrOXYzine (VISTARIL) 25 MG capsule       Meds ordered this encounter  Medications   buPROPion (WELLBUTRIN XL) 300 MG 24 hr tablet    Sig: Take 1 tablet (300 mg total) by mouth daily.    Dispense:  90 tablet    Refill:  1   hydrOXYzine (VISTARIL) 25 MG capsule    Sig: Take 1 capsule (25 mg total) by mouth at bedtime as needed.    Dispense:  30 capsule    Refill:  0    Follow-up: Return in about 3 months (around 02/27/2021) for Annual physical.    Anabel Halon, MD

## 2020-11-27 NOTE — Patient Instructions (Signed)
Please start taking Vistaril as needed for insomnia.  Please maintain simple sleep hygiene. - Maintain dark and non-noisy environment in the bedroom. - Please use the bedroom for sleep and sexual activity only. - Do not use electronic devices in the bedroom. - Please take dinner at least 2 hours before bedtime. - Please avoid caffeinated products in the evening, including coffee, soft drinks. - Please try to maintain the regular sleep-wake cycle - Go to bed and wake up at the same time.   Please continue taking Wellbutrin as prescribed.  Please get fasting blood tests done before the next visit.  You are being referred to Ob/Gyn for PAP smear.

## 2021-01-07 ENCOUNTER — Encounter: Payer: No Typology Code available for payment source | Admitting: Adult Health

## 2021-02-15 ENCOUNTER — Encounter: Payer: Self-pay | Admitting: Emergency Medicine

## 2021-02-15 ENCOUNTER — Other Ambulatory Visit: Payer: Self-pay

## 2021-02-15 ENCOUNTER — Ambulatory Visit
Admission: EM | Admit: 2021-02-15 | Discharge: 2021-02-15 | Disposition: A | Discharge status: Home / Self Care | Payer: No Typology Code available for payment source | Attending: Family Medicine | Admitting: Family Medicine

## 2021-02-15 DIAGNOSIS — J020 Streptococcal pharyngitis: Secondary | ICD-10-CM

## 2021-02-15 LAB — POCT INFLUENZA A/B
Influenza A, POC: NEGATIVE
Influenza B, POC: NEGATIVE

## 2021-02-15 LAB — POCT RAPID STREP A (OFFICE): Rapid Strep A Screen: POSITIVE — AB

## 2021-02-15 MED ORDER — AZITHROMYCIN 250 MG PO TABS: ORAL_TABLET | ORAL | 0 refills | Status: DC

## 2021-02-15 MED ORDER — LIDOCAINE VISCOUS HCL 2 % MT SOLN: 5.0000 mL | OROMUCOSAL | 0 refills | Status: AC | PRN

## 2021-02-15 NOTE — ED Provider Notes (Signed)
[facility]    CSN: [id number] Arrival date & time: 02/15/21  7616      History   Chief Complaint Chief Complaint  Patient presents with   Fever   Sore Throat   Chills    HPI [name] [name] is a 25 y.o. female.   Presenting today with 1 day history of sudden onset sore throat, fever, chills, aches, difficulty swallowing.  Denies chest pain, shortness of breath, abdominal pain, nausea vomiting or diarrhea.  States she has been recovering from the flu for the past week.  No new sick contacts recently.  Not trying anything over-the-counter for symptoms thus far.   Past Medical History:  Diagnosis Date   Cholelithiasis with chronic cholecystitis 06/21/2017   Closed right ankle fracture 06/15/2012   Overview:  recurrent Formatting of this note might be different from the original. Overview:  recurrent   Gallstones    History of attention deficit hyperactivity disorder (ADHD) 06/23/2015   Medical history non-contributory    Stress headaches 06/23/2015    Patient Active Problem List   Diagnosis Date Noted   Insomnia due to other mental disorder 07/20/2019   Nicotine abuse 06/19/2019   Nasal septal deviation 06/11/2019   Depression, major, single episode, moderate (HCC) 03/21/2019   Nasal polyp 03/21/2019   Cough with sputum 06/01/2017   Obesity (BMI 30.0-34.9) 06/23/2015    Past Surgical History:  Procedure Laterality Date   CESAREAN SECTION N/A 02/11/2017   Procedure: Primary CESAREAN SECTION;  Surgeon: Olivia Mackie, MD;  Location: [facility] BIRTHING SUITES;  Service: Obstetrics;  Laterality: N/A;  EDD: 03/20/17   CHOLECYSTECTOMY N/A 06/21/2017   Procedure: LAPAROSCOPIC CHOLECYSTECTOMY WITH INTRAOPERATIVE CHOLANGIOGRAM;  Surgeon: Darnell Level, MD;  Location: [facility] ORS;  Service: General;  Laterality: N/A;   NO PAST SURGERIES      OB History     Gravida  1   Para  1   Term      Preterm  1   AB      Living  1      SAB      IAB      Ectopic       Multiple  0   Live Births  1            Home Medications    Prior to Admission medications   Medication Sig Start Date End Date Taking? Authorizing Provider  azithromycin (ZITHROMAX) 250 MG tablet Take first 2 tablets together, then 1 every day until finished. 02/15/21  Yes Particia Nearing, PA-C  lidocaine (XYLOCAINE) 2 % solution Use as directed 5 mLs in the mouth or throat every 3 (three) hours as needed for mouth pain. 02/15/21  Yes Particia Nearing, PA-C  albuterol (VENTOLIN HFA) 108 (90 Base) MCG/ACT inhaler Inhale 1-2 puffs into the lungs every 6 (six) hours as needed for wheezing or shortness of breath. 04/02/20   Freddy Finner, NP  buPROPion (WELLBUTRIN XL) 300 MG 24 hr tablet Take 1 tablet (300 mg total) by mouth daily. 11/27/20   Anabel Halon, MD  hydrOXYzine (VISTARIL) 25 MG capsule Take 1 capsule (25 mg total) by mouth at bedtime as needed. 11/27/20   Anabel Halon, MD    Family History Family History  Problem Relation Age of Onset   Heart disease Mother    Hypertension Mother 7       PULMONARY ARTERIAL HYPERTENSION   Mental retardation Maternal Uncle    Hypertension Father  Healthy Sister    Healthy Brother    Healthy Sister    Healthy Sister    Healthy Brother    Healthy Brother    Healthy Brother     Social History Social History   Tobacco Use   Smoking status: Every Day    Types: E-cigarettes   Smokeless tobacco: Never   Tobacco comments:    quit 5 years ago  Vaping Use   Vaping Use: Never used  Substance Use Topics   Alcohol use: Yes    Comment: occasional   Drug use: No     Allergies   Penicillins  Review of Systems Review of Systems Per HPI  Physical Exam Triage Vital Signs ED Triage Vitals  Enc Vitals Group     BP 02/15/21 0950 108/74     Pulse Rate 02/15/21 0950 95     Resp 02/15/21 0950 20     Temp 02/15/21 0950 99 F (37.2 C)     Temp Source 02/15/21 0950 Oral     SpO2 02/15/21 0950 96 %     Weight  --      Height --      Head Circumference --      Peak Flow --      Pain Score 02/15/21 0952 3     Pain Loc --      Pain Edu? --      Excl. in GC? --    No data found.  Updated Vital Signs BP 108/74 (BP Location: Right Arm)   Pulse 95   Temp 99 F (37.2 C) (Oral)   Resp 20   SpO2 96%   Visual Acuity Right Eye Distance:   Left Eye Distance:   Bilateral Distance:    Right Eye Near:   Left Eye Near:    Bilateral Near:     Physical Exam Vitals and nursing note reviewed.  Constitutional:      Appearance: Normal appearance. She is not ill-appearing.  HENT:     Head: Atraumatic.     Nose: Nose normal.     Mouth/Throat:     Mouth: Mucous membranes are moist.     Pharynx: Oropharyngeal exudate and posterior oropharyngeal erythema present.     Comments: Bilateral tonsillar edema, erythema, exudates.  Uvula midline, oral airway patent Eyes:     Extraocular Movements: Extraocular movements intact.     Conjunctiva/sclera: Conjunctivae normal.  Cardiovascular:     Rate and Rhythm: Normal rate and regular rhythm.     Heart sounds: Normal heart sounds.  Pulmonary:     Effort: Pulmonary effort is normal.     Breath sounds: Normal breath sounds. No wheezing or rales.  Musculoskeletal:        General: Normal range of motion.     Cervical back: Normal range of motion and neck supple.  Skin:    General: Skin is warm and dry.  Neurological:     Mental Status: She is alert and oriented to person, place, and time.  Psychiatric:        Mood and Affect: Mood normal.        Thought Content: Thought content normal.        Judgment: Judgment normal.     UC Treatments / Results  Labs (all labs ordered are listed, but only abnormal results are displayed) Labs Reviewed  POCT RAPID STREP A (OFFICE) - Abnormal; Notable for the following components:      Result Value   Rapid Strep  A Screen Positive (*)    All other components within normal limits  POCT INFLUENZA A/B     EKG   Radiology No results found.  Procedures Procedures (including critical care time)  Medications Ordered in UC Medications - No data to display  Initial Impression / Assessment and Plan / UC Course  I have reviewed the triage vital signs and the nursing notes.  Pertinent labs & imaging results that were available during my care of the patient were reviewed by me and considered in my medical decision making (see chart for details).     Rapid strep test positive, will treat with azithromycin given penicillin allergy, viscous lidocaine.  Work note given.  Discussed supportive over-the-counter medications and home care.  Return for acutely worsening symptoms.  Final Clinical Impressions(s) / UC Diagnoses   Final diagnoses:  Strep pharyngitis   Discharge Instructions   None    ED Prescriptions     Medication Sig Dispense Auth. Provider   azithromycin (ZITHROMAX) 250 MG tablet Take first 2 tablets together, then 1 every day until finished. 6 tablet Particia Nearing, PA-C   lidocaine (XYLOCAINE) 2 % solution Use as directed 5 mLs in the mouth or throat every 3 (three) hours as needed for mouth pain. 100 mL Particia Nearing, [place]      PDMP not reviewed this encounter.   Particia Nearing, [place] 02/15/21 1255

## 2021-02-15 NOTE — ED Triage Notes (Addendum)
Pt presents today with c/o of sore throat, fever, chills that began last evening. No OTC meds taken pta. She does report family is recovering from Flu.

## 2021-02-17 ENCOUNTER — Encounter: Payer: No Typology Code available for payment source | Admitting: Adult Health

## 2021-02-27 ENCOUNTER — Encounter: Payer: No Typology Code available for payment source | Admitting: Internal Medicine

## 2021-03-25 ENCOUNTER — Ambulatory Visit: Payer: Self-pay

## 2021-04-21 ENCOUNTER — Other Ambulatory Visit: Payer: Self-pay

## 2021-04-21 ENCOUNTER — Ambulatory Visit (INDEPENDENT_AMBULATORY_CARE_PROVIDER_SITE_OTHER): Payer: No Typology Code available for payment source | Admitting: Internal Medicine

## 2021-04-21 ENCOUNTER — Encounter: Payer: Self-pay | Admitting: Internal Medicine

## 2021-04-21 VITALS — BP 118/82 | HR 90 | Resp 18 | Ht 64.0 in | Wt 188.0 lb

## 2021-04-21 DIAGNOSIS — Z8659 Personal history of other mental and behavioral disorders: Secondary | ICD-10-CM | POA: Diagnosis not present

## 2021-04-21 DIAGNOSIS — Z0001 Encounter for general adult medical examination with abnormal findings: Secondary | ICD-10-CM | POA: Diagnosis not present

## 2021-04-21 DIAGNOSIS — N926 Irregular menstruation, unspecified: Secondary | ICD-10-CM

## 2021-04-21 DIAGNOSIS — E559 Vitamin D deficiency, unspecified: Secondary | ICD-10-CM

## 2021-04-21 DIAGNOSIS — F321 Major depressive disorder, single episode, moderate: Secondary | ICD-10-CM | POA: Diagnosis not present

## 2021-04-21 DIAGNOSIS — Z2821 Immunization not carried out because of patient refusal: Secondary | ICD-10-CM

## 2021-04-21 LAB — POCT URINE PREGNANCY: Preg Test, Ur: NEGATIVE

## 2021-04-21 MED ORDER — VENLAFAXINE HCL 37.5 MG PO TABS: 37.5000 mg | ORAL_TABLET | Freq: Two times a day (BID) | ORAL | 3 refills | Status: DC

## 2021-04-21 NOTE — Patient Instructions (Signed)
Please take Wellbutrin 1/2 tablet for next 2 weeks and then stop taking it. Start taking Effexor 1 week later.  Please continue to take Vistaril as needed for insomnia.  Please continue to follow heart healthy diet and perform moderate exercise/walking at least 150 mins/week.

## 2021-04-21 NOTE — Progress Notes (Signed)
Established Patient Office Visit  Subjective:  Patient ID: [name] [name], female    DOB: [birth year]  Age: 26 y.o. MRN: [id number]  CC:  Chief Complaint  Patient presents with   Annual Exam    Annual exam pt take bupropion but feels as if 2 weeks before her period comes on she gets aggressive and snapping on people also periods have changed in the last 6 months and are down to 1 day and spotting so not sure if this is due to hormones    HPI [name] [name] is a 26 y.o. female with past medical history of MDD and tobacco abuse who presents for annual physical.  MDD: She has been having episodes of agitation or aggression and anger spells about 2 weeks before her menstruation for the last 6 months. She attributes it to Wellbutrin. She states that her anhedonia has improved with Wellbutrin, but feels more agitated now. She also has h/o ADHD and was on Vyvanse in the past, but has not had it for many years. Does not have inattention concern currently.  She also reports having lesser intensity menstrual cycle, which last for only 1 day. She is sexually active and not on any contraception.   Past Medical History:  Diagnosis Date   Cholelithiasis with chronic cholecystitis 06/21/2017   Closed right ankle fracture 06/15/2012   Overview:  recurrent Formatting of this note might be different from the original. Overview:  recurrent   Gallstones    History of attention deficit hyperactivity disorder (ADHD) 06/23/2015   Medical history non-contributory    Stress headaches 06/23/2015    Past Surgical History:  Procedure Laterality Date   CESAREAN SECTION N/A 02/11/2017   Procedure: Primary CESAREAN SECTION;  Surgeon: Brien Few, MD;  Location: [facility];  Service: Obstetrics;  Laterality: N/A;  EDD: 03/20/17   CHOLECYSTECTOMY N/A 06/21/2017   Procedure: LAPAROSCOPIC CHOLECYSTECTOMY WITH INTRAOPERATIVE CHOLANGIOGRAM;  Surgeon: Armandina Gemma, MD;  Location: [facility];  Service: General;   Laterality: N/A;   NO PAST SURGERIES      Family History  Problem Relation Age of Onset   Heart disease Mother    Hypertension Mother 52       PULMONARY ARTERIAL HYPERTENSION   Mental retardation Maternal Uncle    Hypertension Father    Healthy Sister    Healthy Brother    Healthy Sister    Healthy Sister    Healthy Brother    Healthy Brother    Healthy Brother     Social History   Socioeconomic History   Marital status: Married    Spouse name: Cipriano Bunker   Number of children: 1   Years of education: 12   Highest education level: 12th grade  Occupational History    Comment: [employer]   Tobacco Use   Smoking status: Former    Types: E-cigarettes    Quit date: 02/03/2021    Years since quitting: 0.2   Smokeless tobacco: Never   Tobacco comments:    quit 5 years ago  Vaping Use   Vaping Use: Never used  Substance and Sexual Activity   Alcohol use: Yes    Comment: occasional   Drug use: No   Sexual activity: Yes    Birth control/protection: None  Other Topics Concern   Not on file  Social History Narrative   Lives with husband Dillion    And [place] [address] in Nov   Two dogs: Coop and [place]  Caffeine: 1 coffee a day   Drinks water   Eats all foods groups: not a lot of red meat   Working to eat healthy and exercise daily.      Wears seatbelt, sunscreen      Smoke detectors good batteries          Social Determinants of Health   Financial Resource Strain: Not on file  Food Insecurity: Not on file  Transportation Needs: Not on file  Physical Activity: Not on file  Stress: Not on file  Social Connections: Not on file  Intimate Partner Violence: Not on file    Outpatient Medications Prior to Visit  Medication Sig Dispense Refill   albuterol (VENTOLIN HFA) 108 (90 Base) MCG/ACT inhaler Inhale 1-2 puffs into the lungs every 6 (six) hours as needed for wheezing or shortness of breath. 8 g 0   buPROPion (WELLBUTRIN XL) 300 MG 24 hr tablet Take  1 tablet (300 mg total) by mouth daily. 90 tablet 1   hydrOXYzine (VISTARIL) 25 MG capsule Take 1 capsule (25 mg total) by mouth at bedtime as needed. 30 capsule 0   lidocaine (XYLOCAINE) 2 % solution Use as directed 5 mLs in the mouth or throat every 3 (three) hours as needed for mouth pain. 100 mL 0   azithromycin (ZITHROMAX) 250 MG tablet Take first 2 tablets together, then 1 every day until finished. (Patient not taking: Reported on 04/21/2021) 6 tablet 0   No facility-administered medications prior to visit.    Allergies  Allergen Reactions   Penicillins Hives    Did it involve swelling of the face/tongue/throat, SOB, or low BP? No Did it involve sudden or severe rash/hives, skin peeling, or any reaction on the inside of your mouth or nose? No Did you need to seek medical attention at a hospital or doctor's office? Unknown-PROBABLY When did it last happen? Childhood reaction type    If all above answers are NO, may proceed with cephalosporin use.     ROS Review of Systems  Constitutional:  Negative for chills and fever.  HENT:  Negative for congestion, sinus pressure, sinus pain and sore throat.   Eyes:  Negative for pain and discharge.  Respiratory:  Negative for cough and shortness of breath.   Cardiovascular:  Negative for chest pain and palpitations.  Gastrointestinal:  Negative for abdominal pain, constipation, diarrhea, nausea and vomiting.  Endocrine: Negative for polydipsia and polyuria.  Genitourinary:  Positive for menstrual problem. Negative for dysuria and hematuria.  Musculoskeletal:  Negative for neck pain and neck stiffness.  Skin:  Negative for rash.  Neurological:  Negative for dizziness and weakness.  Psychiatric/Behavioral:  Positive for sleep disturbance. Negative for agitation and behavioral problems. The patient is nervous/anxious.      Objective:    Physical Exam Vitals reviewed.  Constitutional:      General: She is not in acute distress.     Appearance: She is obese. She is not diaphoretic.  HENT:     Head: Normocephalic and atraumatic.     Nose: Nose normal. No congestion.     Mouth/Throat:     Mouth: Mucous membranes are moist.     Pharynx: No posterior oropharyngeal erythema.  Eyes:     General: No scleral icterus.    Extraocular Movements: Extraocular movements intact.  Cardiovascular:     Rate and Rhythm: Normal rate and regular rhythm.     Pulses: Normal pulses.     Heart sounds: Normal heart sounds. No  murmur heard. Pulmonary:     Breath sounds: Normal breath sounds. No wheezing or rales.  Abdominal:     Palpations: Abdomen is soft.     Tenderness: There is no abdominal tenderness.  Musculoskeletal:     Cervical back: Neck supple. No tenderness.     Right lower leg: No edema.     Left lower leg: No edema.  Skin:    General: Skin is warm.     Findings: No rash.  Neurological:     General: No focal deficit present.     Mental Status: She is alert and oriented to person, place, and time.     Cranial Nerves: No cranial nerve deficit.     Sensory: No sensory deficit.     Motor: No weakness.  Psychiatric:        Mood and Affect: Mood normal.        Behavior: Behavior normal.    BP 118/82 (BP Location: Right Arm, Patient Position: Sitting, Cuff Size: Normal)    Pulse 90    Resp 18    Ht _0  (1.626 m)    Wt 188 lb (85.3 kg)    LMP 04/19/2021    SpO2 99%    BMI 32.27 kg/m  Wt Readings from Last 3 Encounters:  04/21/21 188 lb (85.3 kg)  11/27/20 189 lb (85.7 kg)  04/16/20 191 lb (86.6 kg)    Lab Results  Component Value Date   TSH 1.570 07/04/2018   Lab Results  Component Value Date   WBC 8.0 07/04/2018   HGB 13.0 07/04/2018   HCT 39.4 07/04/2018   MCV 87 07/04/2018   PLT 351 07/04/2018   Lab Results  Component Value Date   NA 138 07/04/2018   K 4.7 07/04/2018   CO2 22 07/04/2018   GLUCOSE 72 07/04/2018   BUN 18 07/04/2018   CREATININE 0.74 07/04/2018   BILITOT 0.3 07/04/2018   ALKPHOS 62  07/04/2018   AST 11 07/04/2018   ALT 13 07/04/2018   PROT 7.2 07/04/2018   ALBUMIN 4.6 07/04/2018   CALCIUM 9.5 07/04/2018   ANIONGAP 8 06/20/2017   Lab Results  Component Value Date   CHOL 159 07/04/2018   Lab Results  Component Value Date   HDL 40 07/04/2018   Lab Results  Component Value Date   LDLCALC 103 (H) 07/04/2018   Lab Results  Component Value Date   TRIG 79 07/04/2018   Lab Results  Component Value Date   CHOLHDL 4.0 07/04/2018   No results found for: HGBA1C    Assessment & Plan:   Problem List Items Addressed This Visit       Other   Depression, major, single episode, moderate ([place])    [place] Office Visit from 04/21/2021 in [facility]  PHQ-9 Total Score 0  Better now, but has agitation/aggression with Wellbutrin Has quit smoking with the help of Wellbutrin now Will try to switch to Effexor Referred to Psychiatry for depression and h/o ADHD Vistaril PRN for anxiety/insomnia      Relevant Medications   venlafaxine (EFFEXOR) 37.5 MG tablet   Other Relevant Orders   Ambulatory referral to Psychiatry   Encounter for general adult medical examination with abnormal findings - Primary    Physical exam as documented. Fasting blood tests today. Refused influenza vaccine. Referred to Ob/Gyn for PAP smear.      Relevant Orders   TSH   Lipid panel   Hemoglobin A1c   CMP14+EGFR  CBC with Differential/Platelet   Other Visit Diagnoses     History of ADHD       Relevant Orders   Ambulatory referral to Psychiatry   Menstrual problem    Urine pregnancy test negative Referred to Ob/Gyn for further evaluation    Relevant Orders   Ambulatory referral to Obstetrics / Gynecology   POCT urine pregnancy   Influenza vaccination declined       Vitamin D deficiency       Relevant Orders   VITAMIN D 25 Hydroxy (Vit-D Deficiency, Fractures)       Meds ordered this encounter  Medications   venlafaxine (EFFEXOR) 37.5 MG tablet     Sig: Take 1 tablet (37.5 mg total) by mouth 2 (two) times daily.    Dispense:  60 tablet    Refill:  3    Follow-up: Return in about 4 months (around 08/19/2021) for Depression.    Lindell Spar, MD

## 2021-04-21 NOTE — Assessment & Plan Note (Signed)
Flowsheet Row Office Visit from 04/21/2021 in [facility]  PHQ-9 Total Score 0     Better now, but has agitation/aggression with Wellbutrin Has quit smoking with the help of Wellbutrin now Will try to switch to Effexor Referred to Psychiatry for depression and h/o ADHD Vistaril PRN for anxiety/insomnia

## 2021-04-21 NOTE — Assessment & Plan Note (Signed)
Physical exam as documented. Fasting blood tests today. Refused influenza vaccine. Referred to Ob/Gyn for PAP smear.

## 2021-04-22 ENCOUNTER — Telehealth: Payer: Self-pay

## 2021-04-22 ENCOUNTER — Telehealth: Payer: Self-pay | Admitting: Internal Medicine

## 2021-04-22 LAB — CMP14+EGFR
ALT: 14 IU/L (ref 0–32)
AST: 13 IU/L (ref 0–40)
Albumin/Globulin Ratio: 1.4 (ref 1.2–2.2)
Albumin: 4.6 g/dL (ref 3.9–5.0)
Alkaline Phosphatase: 63 IU/L (ref 44–121)
BUN/Creatinine Ratio: 14 (ref 9–23)
BUN: 11 mg/dL (ref 6–20)
Bilirubin Total: 0.2 mg/dL (ref 0.0–1.2)
CO2: 23 mmol/L (ref 20–29)
Calcium: 9.5 mg/dL (ref 8.7–10.2)
Chloride: 103 mmol/L (ref 96–106)
Creatinine, Ser: 0.79 mg/dL (ref 0.57–1.00)
Globulin, Total: 3.2 g/dL (ref 1.5–4.5)
Glucose: 57 mg/dL — ABNORMAL LOW (ref 70–99)
Potassium: 5 mmol/L (ref 3.5–5.2)
Sodium: 141 mmol/L (ref 134–144)
Total Protein: 7.8 g/dL (ref 6.0–8.5)
eGFR: 105 mL/min/{1.73_m2} (ref >59)

## 2021-04-22 LAB — VITAMIN D 25 HYDROXY (VIT D DEFICIENCY, FRACTURES): Vit D, 25-Hydroxy: 20.3 ng/mL — ABNORMAL LOW (ref 30.0–100.0)

## 2021-04-22 LAB — HEMOGLOBIN A1C
Est. average glucose Bld gHb Est-mCnc: 105 mg/dL
Hgb A1c MFr Bld: 5.3 % (ref 4.8–5.6)

## 2021-04-22 LAB — CBC WITH DIFFERENTIAL/PLATELET
Basophils Absolute: 0.1 10*3/uL (ref 0.0–0.2)
Basos: 1 %
EOS (ABSOLUTE): 0.2 10*3/uL (ref 0.0–0.4)
Eos: 2 %
Hematocrit: 42.5 % (ref 34.0–46.6)
Hemoglobin: 14 g/dL (ref 11.1–15.9)
Immature Grans (Abs): 0 10*3/uL (ref 0.0–0.1)
Immature Granulocytes: 0 %
Lymphocytes Absolute: 3 10*3/uL (ref 0.7–3.1)
Lymphs: 31 %
MCH: 28.8 pg (ref 26.6–33.0)
MCHC: 32.9 g/dL (ref 31.5–35.7)
MCV: 87 fL (ref 79–97)
Monocytes Absolute: 0.8 10*3/uL (ref 0.1–0.9)
Monocytes: 8 %
Neutrophils Absolute: 5.6 10*3/uL (ref 1.4–7.0)
Neutrophils: 58 %
Platelets: 351 10*3/uL (ref 150–450)
RBC: 4.86 x10E6/uL (ref 3.77–5.28)
RDW: 12.2 % (ref 11.7–15.4)
WBC: 9.7 10*3/uL (ref 3.4–10.8)

## 2021-04-22 LAB — LIPID PANEL
Chol/HDL Ratio: 3.5 ratio (ref 0.0–4.4)
Cholesterol, Total: 170 mg/dL (ref 100–199)
HDL: 48 mg/dL
LDL Chol Calc (NIH): 100 mg/dL — ABNORMAL HIGH (ref 0–99)
Triglycerides: 125 mg/dL (ref 0–149)
VLDL Cholesterol Cal: 22 mg/dL (ref 5–40)

## 2021-04-22 LAB — TSH: TSH: 2.05 u[IU]/mL (ref 0.450–4.500)

## 2021-04-22 NOTE — Telephone Encounter (Signed)
Pt notified of lab results with verbal understanding  

## 2021-04-22 NOTE — Telephone Encounter (Signed)
Please see other tele message  °

## 2021-04-22 NOTE — Telephone Encounter (Signed)
Patient returning call for lab results. 

## 2021-04-22 NOTE — Telephone Encounter (Signed)
Pt returning call

## 2021-05-11 ENCOUNTER — Ambulatory Visit: Payer: No Typology Code available for payment source | Admitting: Nurse Practitioner

## 2021-05-11 ENCOUNTER — Other Ambulatory Visit: Payer: Self-pay

## 2021-05-11 ENCOUNTER — Encounter: Payer: Self-pay | Admitting: Nurse Practitioner

## 2021-05-11 VITALS — BP 130/78 | HR 88 | Ht 64.0 in | Wt 190.0 lb

## 2021-05-11 DIAGNOSIS — J339 Nasal polyp, unspecified: Secondary | ICD-10-CM

## 2021-05-11 DIAGNOSIS — J321 Chronic frontal sinusitis: Secondary | ICD-10-CM

## 2021-05-11 DIAGNOSIS — R0981 Nasal congestion: Secondary | ICD-10-CM | POA: Insufficient documentation

## 2021-05-11 DIAGNOSIS — J309 Allergic rhinitis, unspecified: Secondary | ICD-10-CM | POA: Insufficient documentation

## 2021-05-11 DIAGNOSIS — J329 Chronic sinusitis, unspecified: Secondary | ICD-10-CM | POA: Insufficient documentation

## 2021-05-11 MED ORDER — FLUTICASONE PROPIONATE 50 MCG/ACT NA SUSP: 2.0000 | Freq: Every day | NASAL | 6 refills | Status: DC

## 2021-05-11 MED ORDER — SALINE NASAL SPRAY 0.65 % NA SOLN: 1.0000 | NASAL | 12 refills | Status: AC | PRN

## 2021-05-11 MED ORDER — CETIRIZINE HCL 10 MG PO TABS: 10.0000 mg | ORAL_TABLET | Freq: Every day | ORAL | 11 refills | Status: DC

## 2021-05-11 NOTE — Progress Notes (Signed)
° °  [name] [name]     MRN: [id number]      DOB: [birth year]   HPI [name] [name] is here for complaints of right sided stuffy nose, sinus pressure, nasal drip, clear nasal drainage symptoms have been ongoing for about 2 years. She stated that its hard for her to breath because of her stuffy nose. A doctor told her that she has deviated septum. He was going to refer her to ENT but she never got to do that. She denies wheezing , sob, cp, palpitation, fever, chills. She has done nasal spray , sudafed, cough medicine, none of them helped . She quit smoking about 6 months ago.    ROS Denies recent fever or chills. Has sinus pressure, nasal congestion, no ear pain or sore throat. Denies chest congestion, productive cough or wheezing. Denies chest pains, palpitations and leg swelling Denies abdominal pain, nausea, vomiting,diarrhea or constipation.   Denies dysuria, frequency, hesitancy or incontinence. Denies joint pain, swelling and limitation in mobility. Denies headaches, seizures, numbness, or tingling. Denies depression, anxiety or insomnia. Denies skin break down or rash.   PE  BP 130/78 (BP Location: Right Arm, Patient Position: Sitting, Cuff Size: Normal)    Pulse 88    Ht 5\' 4"  (1.626 m)    Wt 190 lb (86.2 kg)    LMP 05/04/2021 (Approximate)    SpO2 100%    BMI 32.61 kg/m   Patient alert and oriented and in no cardiopulmonary distress.  HEENT: No facial asymmetry, EOMI Neck supple . Left  side nasal turbinate is red,enlarged, right side is patent. Nasal passage moist bilaterally. Voiced tenderness of frontal sinuses on palpation.   Chest: Clear to auscultation bilaterally.  CVS: S1, S2 no murmurs, no S3.Regular rate.  ABD: Soft non tender.   Ext: No edema  MS: Adequate ROM spine, shoulders, hips and knees.  Skin: Intact, no ulcerations or rash noted.  Psych: Good eye contact, normal affect. Memory intact not anxious or depressed appearing.  CNS: CN 2-12 intact, power,  normal  throughout.no focal deficits noted.   Assessment & Plan

## 2021-05-11 NOTE — Patient Instructions (Addendum)
Take cetirizine 10mg  daily. Use Flonase nasal spray daily Use saline nasal spray as needed.   It is important that you exercise regularly at least 30 minutes 5 times a week.  Think about what you will eat, plan ahead. Choose " clean, green, fresh or frozen" over canned, processed or packaged foods which are more sugary, salty and fatty. 70 to 75% of food eaten should be vegetables and fruit. Three meals at set times with snacks allowed between meals, but they must be fruit or vegetables. Aim to eat over a 12 hour period , example 7 am to 7 pm, and STOP after  your last meal of the day. Drink water,generally about 64 ounces per day, no other drink is as healthy. Fruit juice is best enjoyed in a healthy way, by EATING the fruit.  Thanks for choosing [facility], we consider it a privelige to serve you.

## 2021-05-11 NOTE — Assessment & Plan Note (Signed)
RX flonase nasal spray Cetirizine 10mg  daily Saline nasal spray prn

## 2021-05-11 NOTE — Assessment & Plan Note (Deleted)
She was told that she will need reconstruction surgery due to the severity, she stated that she never got a referral to the specialist who would do surgery. Will send another referral today.

## 2021-05-11 NOTE — Assessment & Plan Note (Signed)
She was told that she had a deviated septum and  that she will need reconstruction surgery due to the severity, she stated that she never got a referral to the specialist who would do surgery. Will send another referral today.    Take flonase nasal spray daily Cetirizine 10mg  daily. Saline nasal spray PRN

## 2021-05-13 ENCOUNTER — Ambulatory Visit: Payer: No Typology Code available for payment source | Admitting: Internal Medicine

## 2021-05-14 LAB — HM PAP SMEAR: HM Pap smear: NORMAL

## 2021-05-26 ENCOUNTER — Ambulatory Visit: Payer: No Typology Code available for payment source | Admitting: Internal Medicine

## 2021-06-23 ENCOUNTER — Encounter: Payer: Self-pay | Admitting: Internal Medicine

## 2021-06-23 ENCOUNTER — Ambulatory Visit: Payer: No Typology Code available for payment source | Admitting: Internal Medicine

## 2021-06-23 ENCOUNTER — Other Ambulatory Visit: Payer: Self-pay

## 2021-06-23 DIAGNOSIS — J309 Allergic rhinitis, unspecified: Secondary | ICD-10-CM | POA: Diagnosis not present

## 2021-06-23 DIAGNOSIS — J342 Deviated nasal septum: Secondary | ICD-10-CM

## 2021-06-23 MED ORDER — AZELASTINE HCL 0.1 % NA SOLN: 2.0000 | Freq: Two times a day (BID) | NASAL | 12 refills | Status: DC

## 2021-06-23 MED ORDER — MONTELUKAST SODIUM 10 MG PO TABS: 10.0000 mg | ORAL_TABLET | Freq: Every day | ORAL | 5 refills | Status: DC

## 2021-06-23 NOTE — Patient Instructions (Signed)
Please start taking Singulair for allergies. ? ?Please use Azelastine nasal spray for allergic sinusitis. ?

## 2021-06-23 NOTE — Progress Notes (Signed)
?  ? ?Virtual Visit via Telephone Note  ? ?This visit type was conducted due to national recommendations for restrictions regarding the COVID-19 Pandemic (e.g. social distancing) in an effort to limit this patient's exposure and mitigate transmission in our community.  Due to her co-morbid illnesses, this patient is at least at moderate risk for complications without adequate follow up.  This format is felt to be most appropriate for this patient at this time.  The patient did not have access to video technology/had technical difficulties with video requiring transitioning to audio format only (telephone).  All issues noted in this document were discussed and addressed.  No physical exam could be performed with this format. ? ?Evaluation Performed:  Follow-up visit ? ?Date:  06/23/2021  ? ?ID:  [name] [name], DOB 29-Jan-1996, MRN [id number] ? ?Patient Location: Home ?Provider Location: Office/Clinic ? ?Participants: Patient ?Location of Patient: Home ?Location of Provider: Telehealth ?Consent was obtain for visit to be over via telehealth. ?I verified that I am speaking with the correct person using two identifiers. ? ?PCP:  Anabel Halon, MD  ? ?Chief Complaint: Nasal congestion and dyspnea ? ?History of Present Illness:   ? ?[name] [name] is a 26 y.o. female who has a televisit for complaint of nasal congestion, which is chronic.  She has history of DNS, for which she is going to see ENT specialist at [facility].  She complains of thick mucus secretions in her nostrils, especially at nighttime.  She states that she has panic episodes due to postnasal drip at times.  She denies any fever, chills or sore throat currently.  She has been using Flonase and takes Zyrtec for allergies currently. ?She asks about a sleep study. ? ?The patient does not have symptoms concerning for COVID-19 infection (fever, chills, cough, or new shortness of breath).  ? ?Past Medical, Surgical, Social History, Allergies, and  Medications have been Reviewed. ? ?Past Medical History:  ?Diagnosis Date  ? Cholelithiasis with chronic cholecystitis 06/21/2017  ? Closed right ankle fracture 06/15/2012  ? Overview:  recurrent Formatting of this note might be different from the original. Overview:  recurrent  ? Gallstones   ? History of attention deficit hyperactivity disorder (ADHD) 06/23/2015  ? Medical history non-contributory   ? Stress headaches 06/23/2015  ? ?Past Surgical History:  ?Procedure Laterality Date  ? CESAREAN SECTION N/A 02/11/2017  ? Procedure: Primary CESAREAN SECTION;  Surgeon: Olivia Mackie, MD;  Location: [facility];  Service: Obstetrics;  Laterality: N/A;  EDD: 03/20/17  ? CHOLECYSTECTOMY N/A 06/21/2017  ? Procedure: LAPAROSCOPIC CHOLECYSTECTOMY WITH INTRAOPERATIVE CHOLANGIOGRAM;  Surgeon: Darnell Level, MD;  Location: [facility];  Service: General;  Laterality: N/A;  ? NO PAST SURGERIES    ?  ? ?Current Meds  ?Medication Sig  ? albuterol (VENTOLIN HFA) 108 (90 Base) MCG/ACT inhaler Inhale 1-2 puffs into the lungs every 6 (six) hours as needed for wheezing or shortness of breath.  ? buPROPion (WELLBUTRIN XL) 300 MG 24 hr tablet Take 1 tablet (300 mg total) by mouth daily.  ? cetirizine (ZYRTEC) 10 MG tablet Take 1 tablet (10 mg total) by mouth daily.  ? fluticasone (FLONASE) 50 MCG/ACT nasal spray Place 2 sprays into both nostrils daily.  ? hydrOXYzine (VISTARIL) 25 MG capsule Take 1 capsule (25 mg total) by mouth at bedtime as needed.  ? sodium chloride (OCEAN) 0.65 % nasal spray Place 1 spray into the nose as needed for congestion.  ? VYVANSE 30  MG capsule Take 30 mg by mouth every morning.  ?  ? ?Allergies:   Penicillins  ? ?ROS:   ?Please see the history of present illness.    ? ?All other systems reviewed and are negative. ? ? ?Labs/Other Tests and Data Reviewed:   ? ?Recent Labs: ?04/21/2021: ALT 14; BUN 11; Creatinine, Ser 0.79; Hemoglobin 14.0; Platelets 351; Potassium 5.0; Sodium 141; TSH 2.050  ? ?Recent Lipid  Panel ?Lab Results  ?Component Value Date/Time  ? CHOL 170 04/21/2021 08:58 AM  ? TRIG 125 04/21/2021 08:58 AM  ? HDL 48 04/21/2021 08:58 AM  ? CHOLHDL 3.5 04/21/2021 08:58 AM  ? LDLCALC 100 (H) 04/21/2021 08:58 AM  ? ? ?Wt Readings from Last 3 Encounters:  ?05/11/21 190 lb (86.2 kg)  ?04/21/21 188 lb (85.3 kg)  ?11/27/20 189 lb (85.7 kg)  ?  ? ?ASSESSMENT & PLAN:   ? ?Allergic sinusitis ?Continue Flonase and Zyrtec ?Added Singulair for allergies ?Azelastine spray PRN for allergies ?Unable to perform sinus lavage due to DNS ?She has concern for OSA, but would defer sleep study until she gets surgery for DNS ? ?Nasal septal deviation ?Needs reconstruction surgery, going to see ENT specialist ? ? ?Time:   ?Today, I have spent 11 minutes reviewing the chart, including problem list, medications, and with the patient with telehealth technology discussing the above problems. ? ? ?Medication Adjustments/Labs and Tests Ordered: ?Current medicines are reviewed at length with the patient today.  Concerns regarding medicines are outlined above.  ? ?Tests Ordered: ?No orders of the defined types were placed in this encounter. ? ? ?Medication Changes: ?No orders of the defined types were placed in this encounter. ? ? ? ?Note: This dictation was prepared with Dragon dictation along with smaller phrase technology. Similar sounding words can be transcribed inadequately or may not be corrected upon review. Any transcriptional errors that result from this process are unintentional.  ?  ? ? ?Disposition:  Follow up  ?Signed, ?Anabel Halon, MD  ?06/23/2021 11:58 AM    ? ?[facility] ?[facility] ?

## 2021-06-23 NOTE — Assessment & Plan Note (Addendum)
Continue Flonase and Zyrtec ?Added Singulair for allergies ?Azelastine spray PRN for allergies ?Unable to perform sinus lavage due to DNS ?She has concern for OSA, but would defer sleep study until she gets surgery for DNS ?

## 2021-06-23 NOTE — Assessment & Plan Note (Signed)
Needs reconstruction surgery, going to see ENT specialist ?

## 2021-06-24 ENCOUNTER — Encounter: Payer: Self-pay | Admitting: *Deleted

## 2021-08-25 ENCOUNTER — Ambulatory Visit: Payer: No Typology Code available for payment source | Admitting: Internal Medicine

## 2021-09-25 ENCOUNTER — Ambulatory Visit: Payer: Self-pay

## 2021-09-25 ENCOUNTER — Ambulatory Visit
Admission: EM | Admit: 2021-09-25 | Discharge: 2021-09-25 | Disposition: A | Discharge status: Home / Self Care | Payer: No Typology Code available for payment source | Attending: Nurse Practitioner | Admitting: Nurse Practitioner

## 2021-09-25 ENCOUNTER — Other Ambulatory Visit: Payer: Self-pay | Admitting: Internal Medicine

## 2021-09-25 DIAGNOSIS — H1032 Unspecified acute conjunctivitis, left eye: Secondary | ICD-10-CM | POA: Diagnosis not present

## 2021-09-25 DIAGNOSIS — J029 Acute pharyngitis, unspecified: Secondary | ICD-10-CM | POA: Diagnosis not present

## 2021-09-25 DIAGNOSIS — R6889 Other general symptoms and signs: Secondary | ICD-10-CM | POA: Diagnosis present

## 2021-09-25 LAB — POCT RAPID STREP A (OFFICE): Rapid Strep A Screen: NEGATIVE

## 2021-09-25 MED ORDER — POLYMYXIN B-TRIMETHOPRIM 10000-0.1 UNIT/ML-% OP SOLN: 2.0000 [drp] | OPHTHALMIC | 0 refills | Status: AC

## 2021-09-25 NOTE — ED Triage Notes (Signed)
Pt presents with nasal congestion, sore throat and swollen left eye with redness, denies vision changes

## 2021-09-25 NOTE — ED Provider Notes (Signed)
[facility]    CSN: [id number] Arrival date & time: 09/25/21  1257      History   Chief Complaint Chief Complaint  Patient presents with   Conjunctivitis   Sore Throat    HPI [name] [name] is a 26 y.o. female.   The history is provided by the patient.   Patient presents with upper respiratory symptoms and left eye symptoms that started over the past several days.  Patient states that the nasal congestion, sore throat and body aches started over the past 24 to 48 hours.  She also developed left eye swelling and redness during that time.  She she states that she did have a fever of 101.  She denies eye pain, visual changes, blurred vision, eye drainage, chills, ear pain, headache, cough, shortness of breath, or GI symptoms.  She states that she has been taking allergy medicine and Mucinex for her symptoms.  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  Past Medical History:  Diagnosis Date   Cholelithiasis with chronic cholecystitis 06/21/2017   Closed right ankle fracture 06/15/2012   Overview:  recurrent Formatting of this note might be different from the original. Overview:  recurrent   Gallstones    History of attention deficit hyperactivity disorder (ADHD) 06/23/2015   Medical history non-contributory    Stress headaches 06/23/2015    Patient Active Problem List   Diagnosis Date Noted   Allergic sinusitis 05/11/2021   Stuffy nose 05/11/2021   Encounter for general adult medical examination with abnormal findings 04/21/2021   Insomnia due to other mental disorder 07/20/2019   Nicotine abuse 06/19/2019   Nasal septal deviation 06/11/2019   Depression, major, single episode, moderate (HCC) 03/21/2019   Nasal polyp 03/21/2019   Cough with sputum 06/01/2017   Obesity (BMI 30.0-34.9) 06/23/2015    Past Surgical History:  Procedure Laterality Date   CESAREAN SECTION N/A 02/11/2017   Procedure: Primary CESAREAN SECTION;  Surgeon: Olivia Mackie, MD;  Location: [facility];  Service: Obstetrics;  Laterality: N/A;  EDD: 03/20/17   CHOLECYSTECTOMY N/A 06/21/2017   Procedure: LAPAROSCOPIC CHOLECYSTECTOMY WITH INTRAOPERATIVE CHOLANGIOGRAM;  Surgeon: Darnell Level, MD;  Location: [facility];  Service: General;  Laterality: N/A;   NO PAST SURGERIES      OB History     Gravida  1   Para  1   Term      Preterm  1   AB      Living  1      SAB      IAB      Ectopic      Multiple  0   Live Births  1            Home Medications    Prior to Admission medications   Medication Sig Start Date End Date Taking? Authorizing Provider  trimethoprim-polymyxin b (POLYTRIM) ophthalmic solution Place 2 drops into both eyes every 4 (four) hours for 7 days. 09/25/21 10/02/21 Yes Leath-Warren, Sadie Haber, NP  albuterol (VENTOLIN HFA) 108 (90 Base) MCG/ACT inhaler Inhale 1-2 puffs into the lungs every 6 (six) hours as needed for wheezing or shortness of breath. 04/02/20   Freddy Finner, NP  azelastine (ASTELIN) 0.1 % nasal spray Place 2 sprays into both nostrils 2 (two) times daily. Use in each nostril as directed 06/23/21   Anabel Halon, MD  buPROPion (WELLBUTRIN XL) 300 MG 24 hr tablet Take 1 tablet (300 mg total) by mouth daily.  11/27/20   Anabel Halon, MD  cetirizine (ZYRTEC) 10 MG tablet Take 1 tablet (10 mg total) by mouth daily. 05/11/21   Paseda, Baird Kay, FNP  fluticasone (FLONASE) 50 MCG/ACT nasal spray Place 2 sprays into both nostrils daily. 05/11/21   Donell Beers, FNP  hydrOXYzine (VISTARIL) 25 MG capsule Take 1 capsule (25 mg total) by mouth at bedtime as needed. 11/27/20   Anabel Halon, MD  lidocaine (XYLOCAINE) 2 % solution Use as directed 5 mLs in the mouth or throat every 3 (three) hours as needed for mouth pain. Patient not taking: Reported on 06/23/2021 02/15/21   Particia Nearing, PA-C  montelukast (SINGULAIR) 10 MG tablet Take 1 tablet (10 mg total) by mouth at bedtime. 06/23/21   Anabel Halon, MD  sodium  chloride (OCEAN) 0.65 % nasal spray Place 1 spray into the nose as needed for congestion. 05/11/21   Paseda, Baird Kay, FNP  VYVANSE 30 MG capsule Take 30 mg by mouth every morning. 06/19/21   [provider]    Family History Family History  Problem Relation Age of Onset   Heart disease Mother    Hypertension Mother 50       PULMONARY ARTERIAL HYPERTENSION   Mental retardation Maternal Uncle    Hypertension Father    Healthy Sister    Healthy Brother    Healthy Sister    Healthy Sister    Healthy Brother    Healthy Brother    Healthy Brother     Social History Social History   Tobacco Use   Smoking status: Former    Types: E-cigarettes    Quit date: 02/03/2021    Years since quitting: 0.6   Smokeless tobacco: Never   Tobacco comments:    quit 5 years ago  Vaping Use   Vaping Use: Never used  Substance Use Topics   Alcohol use: Not Currently    Comment: occasional   Drug use: No     Allergies   Penicillins   Review of Systems Review of Systems Per HPI  Physical Exam Triage Vital Signs ED Triage Vitals  Enc Vitals Group     BP 09/25/21 1312 126/84     Pulse Rate 09/25/21 1312 85     Resp 09/25/21 1312 18     Temp 09/25/21 1312 98.4 F (36.9 C)     Temp src --      SpO2 09/25/21 1312 96 %     Weight --      Height --      Head Circumference --      Peak Flow --      Pain Score 09/25/21 1310 8     Pain Loc --      Pain Edu? --      Excl. in GC? --    No data found.  Updated Vital Signs BP 126/84   Pulse 85   Temp 98.4 F (36.9 C)   Resp 18   LMP 09/10/2021 (Approximate)   SpO2 96%   Visual Acuity Right Eye Distance:   Left Eye Distance:   Bilateral Distance:    Right Eye Near:   Left Eye Near:    Bilateral Near:     Physical Exam Vitals and nursing note reviewed.  Constitutional:      General: She is not in acute distress.    Appearance: She is well-developed.  HENT:     Head: Normocephalic.     Right Ear:  Tympanic  membrane and ear canal normal.     Left Ear: Tympanic membrane and ear canal normal.     Nose: Nose normal. No congestion.     Mouth/Throat:     Pharynx: Pharyngeal swelling and posterior oropharyngeal erythema present.     Tonsils: No tonsillar exudate. 1+ on the right. 1+ on the left.  Eyes:     General: Vision grossly intact. No visual field deficit or scleral icterus.       Left eye: No foreign body, discharge or hordeolum.     Extraocular Movements: Extraocular movements intact.     Left eye: Normal extraocular motion and no nystagmus.     Conjunctiva/sclera:     Left eye: Left conjunctiva is injected. No chemosis, exudate or hemorrhage.    Pupils: Pupils are equal, round, and reactive to light.     Comments: Swelling noted to the left upper lower eyelid.  Cardiovascular:     Rate and Rhythm: Normal rate and regular rhythm.     Pulses: Normal pulses.     Heart sounds: Normal heart sounds.  Pulmonary:     Effort: Pulmonary effort is normal.     Breath sounds: Normal breath sounds.  Abdominal:     General: Bowel sounds are normal.     Palpations: Abdomen is soft.  Musculoskeletal:     Cervical back: Normal range of motion.  Skin:    General: Skin is warm and dry.  Neurological:     General: No focal deficit present.     Mental Status: She is alert and oriented to person, place, and time.  Psychiatric:        Mood and Affect: Mood normal.        Behavior: Behavior normal.      UC Treatments / Results  Labs (all labs ordered are listed, but only abnormal results are displayed) Labs Reviewed  CULTURE, GROUP A STREP ([facility])  COVID-19, FLU A+B NAA  POCT RAPID STREP A (OFFICE)    EKG   Radiology No results found.  Procedures Procedures (including critical care time)  Medications Ordered in UC Medications - No data to display  Initial Impression / Assessment and Plan / UC Course  I have reviewed the triage vital signs and the nursing notes.  Pertinent labs &  imaging results that were available during my care of the patient were reviewed by me and considered in my medical decision making (see chart for details).  Patient presents with upper respiratory and eye complaints.  Patient's vital signs are stable, she is in no acute distress.  Patient has swelling of the left eye.  We will treat with Polytrim eyedrops at this time.  Discussed with patient that if symptoms do not improve within the next 48 hours, to please contact our clinic and an oral antibiotic will be provided.  With regard to her upper respiratory symptoms, rapid strep test is negative.  Throat culture and COVID/flu test are pending.  Supportive care recommendations were provided to the patient.  Patient advised to follow-up if symptoms do not improve. Final Clinical Impressions(s) / UC Diagnoses   Final diagnoses:  Flu-like symptoms  Acute conjunctivitis of left eye, unspecified acute conjunctivitis type     Discharge Instructions      The strep test is negative.  A throat culture has been ordered.  Contacted if the throat culture and flu/COVID test are positive. Increase fluids and allow for plenty of rest. May take ibuprofen or Tylenol for  pain, fever, or general discomfort.  This will also help with your body aches. Cool compresses to the left eye to help with pain and swelling. Do not rub or irritate the eye while symptoms persist. Strict handwashing while symptoms are present with your eyes. Follow-up if symptoms worsen or do not improve.      ED Prescriptions     Medication Sig Dispense Auth. Provider   trimethoprim-polymyxin b (POLYTRIM) ophthalmic solution Place 2 drops into both eyes every 4 (four) hours for 7 days. 10 mL Leath-Warren, Sadie Haber, NP      PDMP not reviewed this encounter.   Abran Cantor, NP 09/25/21 1337

## 2021-09-26 LAB — COVID-19, FLU A+B NAA
Influenza A, NAA: NOT DETECTED
Influenza B, NAA: NOT DETECTED
SARS-CoV-2, NAA: NOT DETECTED

## 2021-09-28 LAB — CULTURE, GROUP A STREP (THRC)

## 2022-01-21 ENCOUNTER — Ambulatory Visit: Payer: Self-pay | Admitting: Family Medicine

## 2022-01-21 ENCOUNTER — Encounter: Payer: Self-pay | Admitting: Internal Medicine

## 2022-04-28 ENCOUNTER — Ambulatory Visit: Payer: Medicaid Other | Admitting: Family Medicine

## 2022-06-25 ENCOUNTER — Ambulatory Visit: Payer: Medicaid Other | Admitting: Family

## 2022-07-28 ENCOUNTER — Encounter: Payer: Self-pay | Admitting: Internal Medicine

## 2022-07-28 ENCOUNTER — Ambulatory Visit: Payer: Managed Care, Other (non HMO) | Admitting: Internal Medicine

## 2022-07-28 VITALS — BP 138/82 | HR 79 | Ht 62.0 in | Wt 170.2 lb

## 2022-07-28 DIAGNOSIS — Z23 Encounter for immunization: Secondary | ICD-10-CM

## 2022-07-28 DIAGNOSIS — F3341 Major depressive disorder, recurrent, in partial remission: Secondary | ICD-10-CM

## 2022-07-28 DIAGNOSIS — J342 Deviated nasal septum: Secondary | ICD-10-CM

## 2022-07-28 DIAGNOSIS — J309 Allergic rhinitis, unspecified: Secondary | ICD-10-CM

## 2022-07-28 DIAGNOSIS — Z0001 Encounter for general adult medical examination with abnormal findings: Secondary | ICD-10-CM

## 2022-07-28 DIAGNOSIS — E559 Vitamin D deficiency, unspecified: Secondary | ICD-10-CM

## 2022-07-28 DIAGNOSIS — F988 Other specified behavioral and emotional disorders with onset usually occurring in childhood and adolescence: Secondary | ICD-10-CM

## 2022-07-28 DIAGNOSIS — E782 Mixed hyperlipidemia: Secondary | ICD-10-CM

## 2022-07-28 HISTORY — DX: Other specified behavioral and emotional disorders with onset usually occurring in childhood and adolescence: F98.8

## 2022-07-28 MED ORDER — FLUTICASONE PROPIONATE 50 MCG/ACT NA SUSP: 2.0000 | Freq: Every day | NASAL | 6 refills | Status: AC

## 2022-07-28 MED ORDER — AZELASTINE HCL 0.1 % NA SOLN: 2.0000 | Freq: Two times a day (BID) | NASAL | 11 refills | Status: AC

## 2022-07-28 NOTE — Assessment & Plan Note (Signed)
Needs reconstruction surgery, has seen ENT specialist

## 2022-07-28 NOTE — Assessment & Plan Note (Signed)
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

## 2022-07-28 NOTE — Assessment & Plan Note (Addendum)
Physical exam as documented. Fasting blood tests today. Tdap vaccine today. 

## 2022-07-28 NOTE — Assessment & Plan Note (Signed)
Advised to follow low cholesterol diet for now 

## 2022-07-28 NOTE — Assessment & Plan Note (Signed)
Well-controlled with Wellbutrin Has quit smoking with the help of Wellbutrin now Followed by Psychiatry for depression and h/o ADHD

## 2022-07-28 NOTE — Assessment & Plan Note (Signed)
Well-controlled with Vyvanse Followed by Psychiatry - Zorita Pang

## 2022-07-28 NOTE — Progress Notes (Signed)
Established Patient Office Visit  Subjective:  Patient ID: [name] [name], female    DOB: Aug 31, 1995  Age: 27 y.o. MRN: [id number]  CC:  Chief Complaint  Patient presents with   Annual Exam    Patient is needing physical for documentation for adoption agency    HPI [name] [name] is a 27 y.o. female with past medical history of MDD and tobacco abuse who presents for annual physical.  MDD: She states that her anhedonia has improved with Wellbutrin, but felt more agitated. She also has h/o ADHD and is on Vyvanse now. Her agitation has improved now. Does not have inattention concern currently.  She has brought a form for medical clearance for adoption agency, which has been filled out and provided to the patient.   Past Medical History:  Diagnosis Date   Cholelithiasis with chronic cholecystitis 06/21/2017   Closed right ankle fracture 06/15/2012   Overview:  recurrent Formatting of this note might be different from the original. Overview:  recurrent   Gallstones    History of attention deficit hyperactivity disorder (ADHD) 06/23/2015   Medical history non-contributory    Stress headaches 06/23/2015    Past Surgical History:  Procedure Laterality Date   CESAREAN SECTION N/A 02/11/2017   Procedure: Primary CESAREAN SECTION;  Surgeon: Olivia Mackie, MD;  Location: [facility];  Service: Obstetrics;  Laterality: N/A;  EDD: 03/20/17   CHOLECYSTECTOMY N/A 06/21/2017   Procedure: LAPAROSCOPIC CHOLECYSTECTOMY WITH INTRAOPERATIVE CHOLANGIOGRAM;  Surgeon: Darnell Level, MD;  Location: [facility];  Service: General;  Laterality: N/A;   NO PAST SURGERIES      Family History  Problem Relation Age of Onset   Heart disease Mother    Hypertension Mother 23       PULMONARY ARTERIAL HYPERTENSION   Mental retardation Maternal Uncle    Hypertension Father    Healthy Sister    Healthy Brother    Healthy Sister    Healthy Sister    Healthy Brother    Healthy Brother    Healthy Brother      Social History   Socioeconomic History   Marital status: Married    Spouse name: Ames Coupe   Number of children: 1   Years of education: 12   Highest education level: 12th grade  Occupational History    Comment: [employer]   Tobacco Use   Smoking status: Former    Types: E-cigarettes    Quit date: 02/03/2021    Years since quitting: 1.4   Smokeless tobacco: Never   Tobacco comments:    quit 5 years ago  Vaping Use   Vaping Use: Never used  Substance and Sexual Activity   Alcohol use: Not Currently    Comment: occasional   Drug use: No   Sexual activity: Yes    Birth control/protection: None  Other Topics Concern   Not on file  Social History Narrative   Lives with husband Dillion    And [place] 2 in Nov   Two dogs: Coop and [place]      Caffeine: 1 coffee a day   Drinks [employer] all foods groups: not a lot of red meat   Working to eat healthy and exercise daily.      Wears seatbelt, sunscreen      Smoke detectors good batteries          Social Determinants of Health   Financial Resource Strain: Low Risk  (08/04/2018)   Overall Financial Resource  Strain (CARDIA)    Difficulty of Paying Living Expenses: Not hard at all  Food Insecurity: No Food Insecurity (08/04/2018)   Hunger Vital Sign    Worried About Running Out of Food in the Last Year: Never true    Ran Out of Food in the Last Year: Never true  Transportation Needs: No Transportation Needs (08/04/2018)   PRAPARE - Administrator, Civil Service (Medical): No    Lack of Transportation (Non-Medical): No  Physical Activity: Unknown (08/04/2018)   Exercise Vital Sign    Days of Exercise per Week: 7 days    Minutes of Exercise per Session: Not on file  Stress: Stress Concern Present (08/04/2018)   [employer] of Occupational Health - Occupational Stress Questionnaire    Feeling of Stress : Very much  Social Connections: Moderately Integrated (08/04/2018)   Social Connection and  Isolation Panel [NHANES]    Frequency of Communication with Friends and Family: More than three times a week    Frequency of Social Gatherings with Friends and Family: More than three times a week    Attends Religious Services: More than 4 times per year    Active Member of [employer] or Organizations: No    Attends Banker Meetings: Never    Marital Status: Married  Catering manager Violence: Not At Risk (08/04/2018)   Humiliation, Afraid, Rape, and Kick questionnaire    Fear of Current or Ex-Partner: No    Emotionally Abused: No    Physically Abused: No    Sexually Abused: No    Outpatient Medications Prior to Visit  Medication Sig Dispense Refill   albuterol (VENTOLIN HFA) 108 (90 Base) MCG/ACT inhaler Inhale 1-2 puffs into the lungs every 6 (six) hours as needed for wheezing or shortness of breath. 8 g 0   azelastine (ASTELIN) 0.1 % nasal spray Place 2 sprays into both nostrils 2 (two) times daily. Use in each nostril as directed 30 mL 12   buPROPion (WELLBUTRIN XL) 300 MG 24 hr tablet Take 1 tablet (300 mg total) by mouth daily. 90 tablet 1   cetirizine (ZYRTEC) 10 MG tablet Take 1 tablet (10 mg total) by mouth daily. 30 tablet 11   fluticasone (FLONASE) 50 MCG/ACT nasal spray Place 2 sprays into both nostrils daily. 16 g 6   hydrOXYzine (VISTARIL) 25 MG capsule Take 1 capsule (25 mg total) by mouth at bedtime as needed. 30 capsule 0   lidocaine (XYLOCAINE) 2 % solution Use as directed 5 mLs in the mouth or throat every 3 (three) hours as needed for mouth pain. (Patient not taking: Reported on 06/23/2021) 100 mL 0   montelukast (SINGULAIR) 10 MG tablet Take 1 tablet (10 mg total) by mouth at bedtime. 30 tablet 5   sodium chloride (OCEAN) 0.65 % nasal spray Place 1 spray into the nose as needed for congestion. 30 mL 12   VYVANSE 30 MG capsule Take 30 mg by mouth every morning.     No facility-administered medications prior to visit.    Allergies  Allergen Reactions    Penicillins Hives    Did it involve swelling of the face/tongue/throat, SOB, or low BP? No Did it involve sudden or severe rash/hives, skin peeling, or any reaction on the inside of your mouth or nose? No Did you need to seek medical attention at a hospital or doctor's office? Unknown-PROBABLY When did it last happen? Childhood reaction type    If all above answers are "NO", may proceed  with cephalosporin use.     ROS Review of Systems  Constitutional:  Negative for chills and fever.  HENT:  Positive for congestion. Negative for sinus pressure, sinus pain and sore throat.   Eyes:  Negative for pain and discharge.  Respiratory:  Negative for cough and shortness of breath.   Cardiovascular:  Negative for chest pain and palpitations.  Gastrointestinal:  Negative for abdominal pain, constipation, diarrhea, nausea and vomiting.  Endocrine: Negative for polydipsia and polyuria.  Genitourinary:  Negative for dysuria and hematuria.  Musculoskeletal:  Negative for neck pain and neck stiffness.  Skin:  Negative for rash.  Neurological:  Negative for dizziness and weakness.  Psychiatric/Behavioral:  Negative for agitation and behavioral problems.       Objective:    Physical Exam Vitals reviewed.  Constitutional:      General: She is not in acute distress.    Appearance: She is obese. She is not diaphoretic.  HENT:     Head: Normocephalic and atraumatic.     Nose: Nose normal. No congestion.     Mouth/Throat:     Mouth: Mucous membranes are moist.     Pharynx: No posterior oropharyngeal erythema.  Eyes:     General: No scleral icterus.    Extraocular Movements: Extraocular movements intact.  Cardiovascular:     Rate and Rhythm: Normal rate and regular rhythm.     Pulses: Normal pulses.     Heart sounds: Normal heart sounds. No murmur heard. Pulmonary:     Breath sounds: Normal breath sounds. No wheezing or rales.  Abdominal:     Palpations: Abdomen is soft.     Tenderness:  There is no abdominal tenderness.  Musculoskeletal:     Cervical back: Neck supple. No tenderness.     Right lower leg: No edema.     Left lower leg: No edema.  Skin:    General: Skin is warm.     Findings: No rash.  Neurological:     General: No focal deficit present.     Mental Status: She is alert and oriented to person, place, and time.     Cranial Nerves: No cranial nerve deficit.     Sensory: No sensory deficit.     Motor: No weakness.  Psychiatric:        Mood and Affect: Mood normal.        Behavior: Behavior normal.     BP 138/82   Pulse 79   Ht  (1.575 m)   Wt 170 lb 3.2 oz (77.2 kg)   SpO2 96%   BMI 31.13 kg/m  Wt Readings from Last 3 Encounters:  07/28/22 170 lb 3.2 oz (77.2 kg)  05/11/21 190 lb (86.2 kg)  04/21/21 188 lb (85.3 kg)    Lab Results  Component Value Date   TSH 2.050 04/21/2021   Lab Results  Component Value Date   WBC 9.7 04/21/2021   HGB 14.0 04/21/2021   HCT 42.5 04/21/2021   MCV 87 04/21/2021   PLT 351 04/21/2021   Lab Results  Component Value Date   NA 141 04/21/2021   K 5.0 04/21/2021   CO2 23 04/21/2021   GLUCOSE 57 (L) 04/21/2021   BUN 11 04/21/2021   CREATININE 0.79 04/21/2021   BILITOT 0.2 04/21/2021   ALKPHOS 63 04/21/2021   AST 13 04/21/2021   ALT 14 04/21/2021   PROT 7.8 04/21/2021   ALBUMIN 4.6 04/21/2021   CALCIUM 9.5 04/21/2021   ANIONGAP 8 06/20/2017  EGFR 105 04/21/2021   Lab Results  Component Value Date   CHOL 170 04/21/2021   Lab Results  Component Value Date   HDL 48 04/21/2021   Lab Results  Component Value Date   LDLCALC 100 (H) 04/21/2021   Lab Results  Component Value Date   TRIG 125 04/21/2021   Lab Results  Component Value Date   CHOLHDL 3.5 04/21/2021   Lab Results  Component Value Date   HGBA1C 5.3 04/21/2021      Assessment & Plan:   Problem List Items Addressed This Visit    Encounter for general adult medical examination with abnormal findings Physical exam as  documented. Fasting blood tests today. Tdap vaccine today.  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   Follow-up: No follow-ups on file.    Anabel Halon, MD

## 2022-09-26 ENCOUNTER — Ambulatory Visit (HOSPITAL_COMMUNITY)
Admission: EM | Admit: 2022-09-26 | Discharge: 2022-09-26 | Disposition: A | Discharge status: Home / Self Care | Payer: Managed Care, Other (non HMO) | Attending: Emergency Medicine | Admitting: Emergency Medicine

## 2022-09-26 ENCOUNTER — Encounter (HOSPITAL_COMMUNITY): Payer: Self-pay

## 2022-09-26 DIAGNOSIS — R102 Pelvic and perineal pain: Secondary | ICD-10-CM | POA: Diagnosis present

## 2022-09-26 DIAGNOSIS — R300 Dysuria: Secondary | ICD-10-CM | POA: Insufficient documentation

## 2022-09-26 LAB — POCT URINALYSIS DIP (MANUAL ENTRY)
Bilirubin, UA: NEGATIVE
Blood, UA: NEGATIVE
Glucose, UA: NEGATIVE mg/dL
Leukocytes, UA: NEGATIVE
Nitrite, UA: NEGATIVE
Protein Ur, POC: NEGATIVE mg/dL
Spec Grav, UA: 1.025 (ref 1.010–1.025)
Urobilinogen, UA: 0.2 E.U./dL
pH, UA: 7 (ref 5.0–8.0)

## 2022-09-26 MED ORDER — FLUCONAZOLE 150 MG PO TABS: 150.0000 mg | ORAL_TABLET | Freq: Every day | ORAL | 0 refills | Status: AC

## 2022-09-26 MED ORDER — FLUCONAZOLE 150 MG PO TABS: 150.0000 mg | ORAL_TABLET | Freq: Every day | ORAL | 0 refills | Status: DC

## 2022-09-26 NOTE — ED Provider Notes (Signed)
[facility]    CSN: [id number] Arrival date & time: 09/26/22  1733      History   Chief Complaint Chief Complaint  Patient presents with   Urinary Tract Infection    HPI [name] [name] is a 27 y.o. female.   Patient presents for evaluation of urinary frequency, urgency, constant lower abdominal pain, intermittent lower back pain and vaginal itching present for 3 days.  Has not attempted treatment.  Sexually active but denies concern for STD.  Denies hematuria, fevers, vaginal discharge, vaginal odor.    Past Medical History:  Diagnosis Date   Attention deficit disorder 07/28/2022   Cholelithiasis with chronic cholecystitis 06/21/2017   Closed right ankle fracture 06/15/2012   Overview:  recurrent Formatting of this note might be different from the original. 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   Nasal polyp 03/21/2019   Obesity (BMI 30.0-34.9) 06/23/2015    Past Surgical History:  Procedure Laterality Date   CESAREAN SECTION N/A 02/11/2017   Procedure: Primary CESAREAN SECTION;  Surgeon: Olivia Mackie, MD;  Location: [facility];  Service: Obstetrics;  Laterality: N/A;  EDD: 03/20/17   CHOLECYSTECTOMY N/A 06/21/2017   Procedure: LAPAROSCOPIC CHOLECYSTECTOMY WITH INTRAOPERATIVE CHOLANGIOGRAM;  Surgeon: Darnell Level, MD;  Location: [facility];  Service: General;  Laterality: N/A;   NO PAST SURGERIES      OB History      Gravida  1   Para  1   Term      Preterm  1   AB      Living  1      SAB      IAB      Ectopic      Multiple  0   Live Births  1            Home Medications    Prior to Admission medications   Medication Sig Start Date End Date Taking? Authorizing Provider  buPROPion (WELLBUTRIN XL) 300 MG 24 hr tablet Take 1 tablet (300 mg total) by mouth daily. 11/27/20  Yes Patel, Earlie Lou, MD  VYVANSE 30 MG capsule Take 30 mg by mouth every morning. 06/19/21  Yes [provider]  albuterol (VENTOLIN HFA) 108 (90 Base) MCG/ACT inhaler Inhale 1-2 puffs into the lungs every 6 (six) hours as needed for wheezing or shortness of breath. 04/02/20   Freddy Finner, NP  azelastine (ASTELIN) 0.1 % nasal spray Place 2 sprays into both nostrils 2 (two) times daily. Use in each nostril as directed 07/28/22   Anabel Halon, MD  fluticasone [facility]) 50 MCG/ACT nasal spray Place 2 sprays into both nostrils daily. 07/28/22   Anabel Halon, MD  lidocaine (XYLOCAINE) 2 % solution Use as directed 5 mLs in the mouth or throat every 3 (three) hours as needed for mouth pain. Patient not taking: Reported on 06/23/2021 02/15/21   Particia Nearing, PA-C  sodium chloride (OCEAN) 0.65 % nasal spray Place 1 spray  into the nose as needed for congestion. 05/11/21   Donell Beers, FNP    Family History Family History  Problem Relation Age of Onset   Heart disease Mother    Hypertension Mother 35       PULMONARY ARTERIAL HYPERTENSION   Mental retardation Maternal Uncle    Hypertension Father    Healthy Sister    Healthy Brother    Healthy Sister    Healthy Sister    Healthy Brother    Healthy Brother    Healthy Brother     Social History Social History   Tobacco Use   Smoking status: Former    Types: E-cigarettes    Quit date: 02/03/2021    Years since quitting: 1.6   Smokeless tobacco: Never   Tobacco comments:    quit 5 years ago  Vaping Use   Vaping Use: Never  used  Substance Use Topics   Alcohol use: Not Currently    Comment: occasional   Drug use: No     Allergies   Penicillins   Review of Systems Review of Systems   Physical Exam Triage Vital Signs ED Triage Vitals  Enc Vitals Group     BP 09/26/22 1808 109/75     Pulse Rate 09/26/22 1808 65     Resp 09/26/22 1808 18     Temp 09/26/22 1808 98.4 F (36.9 C)     Temp Source 09/26/22 1808 Oral     SpO2 09/26/22 1808 98 %     Weight --      Height --      Head Circumference --      Peak Flow --      Pain Score 09/26/22 1806 4     Pain Loc --      Pain Edu? --      Excl. in GC? --    No data found.  Updated Vital Signs BP 109/75 (BP Location: Left Arm)   Pulse 65   Temp 98.4 F (36.9 C) (Oral)   Resp 18   LMP 09/05/2022 (Approximate)   SpO2 98%   Visual Acuity Right Eye Distance:   Left Eye Distance:   Bilateral Distance:    Right Eye Near:   Left Eye Near:    Bilateral Near:     Physical Exam Constitutional:      Appearance: Normal appearance.  Eyes:     Extraocular Movements: Extraocular movements intact.  Pulmonary:     Effort: Pulmonary effort is normal.  Genitourinary:    Comments: deferred Neurological:     Mental Status: She is alert and oriented to person, place, and time. Mental status is at baseline.      UC Treatments / Results  Labs (all labs ordered are listed, but only abnormal results are displayed) Labs Reviewed  POCT URINALYSIS DIP (MANUAL ENTRY)    EKG   Radiology No results found.  Procedures Procedures (including critical care time)  Medications Ordered in UC Medications - No data to display  Initial Impression / Assessment and Plan / UC Course  I have reviewed the triage vital signs and the nursing notes.  Pertinent labs & imaging results that were available during my care of the patient were reviewed by me and considered in my medical decision making (see chart for details).  Dysuria, acute suprapubic  pain  This is a stable patient no signs of distress nontoxic-appearing, urinalysis negative, sent for culture, prophylactically treating for yeast while labs are pending,  Diflucan sent to pharmacy, discussed additional supportive measures with follow-up as needed Final Clinical Impressions(s) / UC Diagnoses   Final diagnoses:  None     Discharge Instructions      Your urinalysis shows Annaleigh Steinmeyer blood cells and nitrates which are indicative of infection, your urine will be sent to the lab to determine exactly which bacteria is present, if any changes need to be made to your medications you will be notified  Begin use of macrobid every morning and every evening for 5 days   You may use over-the-counter Azo to help minimize your symptoms until antibiotic removes bacteria, this medication will turn your urine orange  Increase your fluid intake through use of water  As always practice good hygiene, wiping front to back and avoidance of scented vaginal products to prevent further irritation  If symptoms continue to persist after use of medication or recur please follow-up with urgent care or your primary doctor as needed    ED Prescriptions   None    PDMP not reviewed this encounter.   Valinda Hoar, [place] 09/29/22 [phone]

## 2022-09-26 NOTE — ED Triage Notes (Signed)
Patient having lower bak/abdominal pain and painful urination. Onset 3 days ago

## 2022-09-26 NOTE — Discharge Instructions (Addendum)
Today you are being treated prophylactically for yeast.   Urinalysis is negative for any signs of infection, urine has been sent to the lab to determine if bacteria will grow, you will be notified if this occurs antibiotics sent to pharmacy  Take diflucan 150 mg once, if symptoms still present in 3 days then you may take second pill   Yeast infections which are caused by a naturally occurring fungus called candida. Vaginosis is an inflammation of the vagina that can result in discharge, itching and pain. The cause is usually a change in the normal balance of vaginal bacteria or an infection. Vaginosis can also result from reduced estrogen levels after menopause.  Labs pending 2-3 days, you will be contacted if positive for any sti and treatment will be sent to the pharmacy,     In addition:   Avoid baths, hot tubs and whirlpool spas.  Don't use scented or harsh soaps, such as those with deodorant or antibacterial action. Avoid irritants. These include scented tampons and pads. Wipe from front to back after using the toilet.  Don't douche. Your vagina doesn't require cleansing other than normal bathing.  Use a  condom. Wear cotton underwear, this fabric helps absorb moisture

## 2022-09-27 LAB — CERVICOVAGINAL ANCILLARY ONLY
Bacterial Vaginitis (gardnerella): NEGATIVE
Candida Glabrata: NEGATIVE
Candida Vaginitis: POSITIVE — AB
Comment: NEGATIVE
Comment: NEGATIVE
Comment: NEGATIVE

## 2022-09-28 LAB — URINE CULTURE

## 2022-10-05 ENCOUNTER — Emergency Department (HOSPITAL_COMMUNITY): Payer: Managed Care, Other (non HMO)

## 2022-10-05 ENCOUNTER — Encounter (HOSPITAL_COMMUNITY): Payer: Self-pay | Admitting: Emergency Medicine

## 2022-10-05 ENCOUNTER — Other Ambulatory Visit: Payer: Self-pay

## 2022-10-05 ENCOUNTER — Emergency Department (HOSPITAL_COMMUNITY)
Admission: EM | Admit: 2022-10-05 | Discharge: 2022-10-05 | Disposition: A | Discharge status: Home / Self Care | Payer: Managed Care, Other (non HMO) | Attending: Emergency Medicine | Admitting: Emergency Medicine

## 2022-10-05 ENCOUNTER — Ambulatory Visit: Payer: Managed Care, Other (non HMO) | Admitting: Internal Medicine

## 2022-10-05 DIAGNOSIS — R1031 Right lower quadrant pain: Secondary | ICD-10-CM | POA: Insufficient documentation

## 2022-10-05 DIAGNOSIS — R109 Unspecified abdominal pain: Secondary | ICD-10-CM

## 2022-10-05 DIAGNOSIS — R112 Nausea with vomiting, unspecified: Secondary | ICD-10-CM | POA: Diagnosis not present

## 2022-10-05 LAB — COMPREHENSIVE METABOLIC PANEL
ALT: 23 U/L (ref 0–44)
AST: 18 U/L (ref 15–41)
Albumin: 3.9 g/dL (ref 3.5–5.0)
Alkaline Phosphatase: 44 U/L (ref 38–126)
Anion gap: 3 — ABNORMAL LOW (ref 5–15)
BUN: 7 mg/dL (ref 6–20)
CO2: 27 mmol/L (ref 22–32)
Calcium: 8.9 mg/dL (ref 8.9–10.3)
Chloride: 108 mmol/L (ref 98–111)
Creatinine, Ser: 0.64 mg/dL (ref 0.44–1.00)
GFR, Estimated: >60 mL/min (ref >60)
Glucose, Bld: 100 mg/dL — ABNORMAL HIGH (ref 70–99)
Potassium: 5.1 mmol/L (ref 3.5–5.1)
Sodium: 138 mmol/L (ref 135–145)
Total Bilirubin: 0.6 mg/dL (ref 0.3–1.2)
Total Protein: 7.1 g/dL (ref 6.5–8.1)

## 2022-10-05 LAB — CBC WITH DIFFERENTIAL/PLATELET
Abs Immature Granulocytes: 0.02 10*3/uL (ref 0.00–0.07)
Basophils Absolute: 0 10*3/uL (ref 0.0–0.1)
Basophils Relative: 0 %
Eosinophils Absolute: 0.1 10*3/uL (ref 0.0–0.5)
Eosinophils Relative: 1 %
HCT: 39.6 % (ref 36.0–46.0)
Hemoglobin: 13.2 g/dL (ref 12.0–15.0)
Immature Granulocytes: 0 %
Lymphocytes Relative: 28 %
Lymphs Abs: 2.5 10*3/uL (ref 0.7–4.0)
MCH: 29.7 pg (ref 26.0–34.0)
MCHC: 33.3 g/dL (ref 30.0–36.0)
MCV: 89 fL (ref 80.0–100.0)
Monocytes Absolute: 0.5 10*3/uL (ref 0.1–1.0)
Monocytes Relative: 6 %
Neutro Abs: 5.7 10*3/uL (ref 1.7–7.7)
Neutrophils Relative %: 65 %
Platelets: 285 10*3/uL (ref 150–400)
RBC: 4.45 MIL/uL (ref 3.87–5.11)
RDW: 12 % (ref 11.5–15.5)
WBC: 9 10*3/uL (ref 4.0–10.5)
nRBC: 0 % (ref 0.0–0.2)

## 2022-10-05 LAB — URINALYSIS, ROUTINE W REFLEX MICROSCOPIC
Bilirubin Urine: NEGATIVE
Glucose, UA: NEGATIVE mg/dL
Hgb urine dipstick: NEGATIVE
Ketones, ur: NEGATIVE mg/dL
Leukocytes,Ua: NEGATIVE
Nitrite: NEGATIVE
Protein, ur: NEGATIVE mg/dL
Specific Gravity, Urine: 1.015 (ref 1.005–1.030)
pH: 5 (ref 5.0–8.0)

## 2022-10-05 LAB — LIPASE, BLOOD: Lipase: 31 U/L (ref 11–51)

## 2022-10-05 LAB — HCG, QUANTITATIVE, PREGNANCY: hCG, Beta Chain, Quant, S: <1 m[IU]/mL

## 2022-10-05 MED ORDER — FENTANYL CITRATE PF 50 MCG/ML IJ SOSY
50.0000 ug | PREFILLED_SYRINGE | Freq: Once | INTRAMUSCULAR | Status: AC
  Administered 2022-10-05: 50 ug via INTRAVENOUS
  Filled 2022-10-05: qty 1

## 2022-10-05 MED ORDER — POLYETHYLENE GLYCOL 3350 17 G PO PACK: 17.0000 g | PACK | Freq: Every day | ORAL | 0 refills | Status: AC

## 2022-10-05 MED ORDER — MORPHINE SULFATE (PF) 4 MG/ML IV SOLN
4.0000 mg | Freq: Once | INTRAVENOUS | Status: AC
  Administered 2022-10-05: 4 mg via INTRAVENOUS
  Filled 2022-10-05: qty 1

## 2022-10-05 MED ORDER — ONDANSETRON HCL 4 MG/2ML IJ SOLN
4.0000 mg | Freq: Once | INTRAMUSCULAR | Status: AC
  Administered 2022-10-05: 4 mg via INTRAVENOUS
  Filled 2022-10-05: qty 2

## 2022-10-05 MED ORDER — IOHEXOL 300 MG/ML  SOLN
100.0000 mL | Freq: Once | INTRAMUSCULAR | Status: AC | PRN
  Administered 2022-10-05: 100 mL via INTRAVENOUS

## 2022-10-05 MED ORDER — SODIUM CHLORIDE 0.9 % IV BOLUS
1000.0000 mL | Freq: Once | INTRAVENOUS | Status: AC
  Administered 2022-10-05: 1000 mL via INTRAVENOUS

## 2022-10-05 NOTE — Discharge Instructions (Addendum)
Was a pleasure taking care of you today.  Your CT scan showed moderate stool but no appendicitis or other acute findings.  Your blood work was all reassuring, follow-up with your primary care doctor, come back to the ER if you have new or worsening symptoms.  Start taking the MiraLAX daily, make sure you drink plenty of water to see if relieving the constipation  improves your abdominal pain.

## 2022-10-05 NOTE — ED Provider Notes (Signed)
[place] EMERGENCY DEPARTMENT AT [facility] Provider Note   CSN: [id number] Arrival date & time: 10/05/22  4098     History  Chief Complaint  Patient presents with   Flank Pain    [name] [name] is a 27 y.o. female.  She has past history of cholecystitis and cholelithiasis status post cholecystectomy in 2019.  No other chronic medical conditions.  Presents the ER with pain to the right flank area, radiates from the side to right mid and lower abdomen and right mid back.  Denies dysuria or frequency or urgency.  She has been having nausea and vomiting.  Symptoms all started this morning.  No fevers or chills, no chest pain or shortness of breath, no other symptoms, no diarrhea, denies bad food exposure, she is never anything like this in the past, did have dysuria for work ago, was seen in urgent care and diagnosed with a yeast infection states her symptoms have been resolved.  He was treated with Diflucan.   Flank Pain       Home Medications Prior to Admission medications   Medication Sig Start Date End Date Taking? Authorizing Provider  polyethylene glycol (MIRALAX) 17 g packet Take 17 g by mouth daily. 10/05/22  Yes Cheryel Kyte A, PA-C  albuterol (VENTOLIN HFA) 108 (90 Base) MCG/ACT inhaler Inhale 1-2 puffs into the lungs every 6 (six) hours as needed for wheezing or shortness of breath. 04/02/20   Freddy Finner, NP  azelastine (ASTELIN) 0.1 % nasal spray Place 2 sprays into both nostrils 2 (two) times daily. Use in each nostril as directed 07/28/22   Anabel Halon, MD  buPROPion (WELLBUTRIN XL) 300 MG 24 hr tablet Take 1 tablet (300 mg total) by mouth daily. 11/27/20   Anabel Halon, MD  fluticasone (FLONASE) 50 MCG/ACT nasal spray Place 2 sprays into both nostrils daily. 07/28/22   Anabel Halon, MD  lidocaine (XYLOCAINE) 2 % solution Use as directed 5 mLs in the mouth or throat every 3 (three) hours as needed for mouth pain. Patient not taking: Reported on  06/23/2021 02/15/21   Particia Nearing, PA-C  sodium chloride (OCEAN) 0.65 % nasal spray Place 1 spray into the nose as needed for congestion. 05/11/21   Paseda, Baird Kay, FNP  VYVANSE 30 MG capsule Take 30 mg by mouth every morning. 06/19/21   [provider]      Allergies    Penicillins    Review of Systems   Review of Systems  Genitourinary:  Positive for flank pain.    Physical Exam Updated Vital Signs BP 114/67   Pulse (!) 53   Temp 98.2 F (36.8 C) (Oral)   Ht 5\' 4"  (1.626 m)   Wt 77.1 kg   LMP 09/01/2022 (Approximate)   SpO2 99%   BMI 29.18 kg/m  Physical Exam Vitals and nursing note reviewed.  Constitutional:      General: She is not in acute distress.    Appearance: She is well-developed.  HENT:     Head: Normocephalic and atraumatic.     Mouth/Throat:     Mouth: Mucous membranes are moist.  Eyes:     Conjunctiva/sclera: Conjunctivae normal.  Cardiovascular:     Rate and Rhythm: Normal rate and regular rhythm.     Heart sounds: No murmur heard. Pulmonary:     Effort: Pulmonary effort is normal. No respiratory distress.     Breath sounds: Normal breath sounds.  Abdominal:  Palpations: Abdomen is soft.     Tenderness: There is abdominal tenderness in the right lower quadrant. There is no right CVA tenderness, left CVA tenderness, guarding or rebound.  Musculoskeletal:        General: No swelling.     Cervical back: Neck supple.  Skin:    General: Skin is warm and dry.     Capillary Refill: Capillary refill takes less than 2 seconds.  Neurological:     General: No focal deficit present.     Mental Status: She is alert and oriented to person, place, and time.  Psychiatric:        Mood and Affect: Mood normal.     ED Results / Procedures / Treatments   Labs (all labs ordered are listed, but only abnormal results are displayed) Labs Reviewed  COMPREHENSIVE METABOLIC PANEL - Abnormal; Notable for the following components:      Result  Value   Glucose, Bld 100 (*)    Anion gap 3 (*)    All other components within normal limits  CBC WITH DIFFERENTIAL/PLATELET  URINALYSIS, ROUTINE W REFLEX MICROSCOPIC  HCG, QUANTITATIVE, PREGNANCY  LIPASE, BLOOD    EKG None  Radiology CT ABDOMEN PELVIS W CONTRAST  Result Date: 10/05/2022 CLINICAL DATA:  Right lower quadrant abdominal pain. EXAM: CT ABDOMEN AND PELVIS WITH CONTRAST TECHNIQUE: Multidetector CT imaging of the abdomen and pelvis was performed using the standard protocol following bolus administration of intravenous contrast. RADIATION DOSE REDUCTION: This exam was performed according to the departmental dose-optimization program which includes automated exposure control, adjustment of the mA and/or kV according to patient size and/or use of iterative reconstruction technique. CONTRAST:  OMNIPAQUE IOHEXOL 300 MG/ML  SOLN COMPARISON:  Ultrasound 03/04/2017 FINDINGS: Lower chest: There is some breathing motion lung bases. Slight basilar atelectasis. No pleural effusion. Hepatobiliary: No focal liver abnormality is seen. Status post cholecystectomy. No biliary dilatation. Pancreas: Unremarkable. No pancreatic ductal dilatation or surrounding inflammatory changes. Spleen: Normal in size without focal abnormality. Adrenals/Urinary Tract: Adrenal glands are unremarkable. Kidneys are normal, without renal calculi, focal lesion, or hydronephrosis. Bladder is unremarkable. Stomach/Bowel: No oral contrast. The stomach has some moderate luminal fluid and debris. Small bowel is nondilated. Large bowel is of normal course and caliber with moderate stool. Normal appendix. Vascular/Lymphatic: No significant vascular findings are present. No enlarged abdominal or pelvic lymph nodes. Reproductive: Uterus and bilateral adnexa are unremarkable. Other: Trace free fluid in the pelvis which could be physiologic. No free intra-air. Musculoskeletal: Schmorl's node changes at L4 on L5. IMPRESSION: Scattered  colonic stool. No bowel obstruction. No free air. Normal appendix. Trace free fluid in the pelvis could be physiologic. Previous cholecystectomy Electronically Signed   By: Karen Kays M.D.   On: 10/05/2022 15:11    Procedures Procedures    Medications Ordered in ED Medications  fentaNYL (SUBLIMAZE) injection 50 mcg (50 mcg Intravenous Given 10/05/22 0939)  ondansetron (ZOFRAN) injection 4 mg (4 mg Intravenous Given 10/05/22 0940)  sodium chloride 0.9 % bolus 1,000 mL (0 mLs Intravenous Stopped 10/05/22 1106)  morphine (PF) 4 MG/ML injection 4 mg (4 mg Intravenous Given 10/05/22 1158)  iohexol (OMNIPAQUE) 300 MG/ML solution 100 mL (100 mLs Intravenous Contrast Given 10/05/22 1403)    ED Course/ Medical Decision Making/ A&P Clinical Course as of 10/05/22 1936  Tue Oct 05, 2022  1314 Patient after morphine she is feeling much better, sitting up in bed.  Labs are still pending as well as CT.  Was informed  by the nurse that family had brought patient a cheeseburger and she had eaten this unfortunately before asking and before we got any further results.  Discussed with nurse patient to be n.p.o. until CT results [CB]    Clinical Course User Index [CB] Ma Rings, PA-C                             Medical Decision Making This patient presents to the ED for concern of right flank pain since this morning with nausea and vomiting, this involves an extensive number of treatment options, and is a complaint that carries with it a high risk of complications and morbidity.  The differential diagnosis includes gastritis, gastroenteritis, appendicitis, ectopic pregnancy, diverticulitis, ovarian torsion, DKA, nephrolithiasis, gastroparesis, other       Additional history obtained:  Additional history obtained from EMR External records from outside source obtained and reviewed including notes, labs.  Alysis negative at urgent care visit sent for culture with multiple species present, vaginal swab  showed that yeast was present which has been treated   Lab Tests:  I Ordered, and personally interpreted labs.  The pertinent results include: Lipase is normal, hCG is negative, CBC and CMP are normal, urinalysis is negative   Imaging Studies ordered:  I ordered imaging studies including abdomen pelvis I independently visualized and interpreted imaging which showed stool in the colon, no other acute abnormalities I agree with the radiologist interpretation     Problem List / ED Course / Critical interventions / Medication management  Patient having abdominal pain and right flank pain today, very uncomfortable in appearance initially, getting better after morphine, did not have relief with fentanyl.  Given her discomfort CT was ordered after discussion with patient, she was feeling much better by this point and had eaten a cheeseburger and CT showed a stool in colon, she does admit to constipation, does not know the last time she had a bowel movement, discussed dietary changes, physical activity, water intake and starting MiraLAX daily and follow-up with PCP.  I have reviewed the patients home medicines and have made adjustments as needed     Amount and/or Complexity of Data Reviewed Labs: ordered. Radiology: ordered.  Risk Prescription drug management.           Final Clinical Impression(s) / ED Diagnoses Final diagnoses:  Right flank pain    Rx / DC Orders ED Discharge Orders          Ordered    polyethylene glycol (MIRALAX) 17 g packet  Daily        10/05/22 [address] 10/05/22 1936    Rondel Baton, MD 10/06/22 [phone]

## 2022-10-05 NOTE — ED Notes (Signed)
Called lab to check on the status of the remaining labs. Morrie Sheldon in the lab stated they were processing and would be finished soon. Provider notified. CT scan will continue to be delayed until labs have resulted.

## 2022-10-05 NOTE — ED Notes (Signed)
CT notified patient is ready for scan.

## 2022-10-05 NOTE — ED Notes (Signed)
Messaged radiology in attempt to get an estimate for her CT scan

## 2022-10-05 NOTE — ED Triage Notes (Signed)
Symptoms started last night. Pain, nausea and vomiting. Pain rated at 7/10. Pt states she had a yeast infection for the last couple of weeks. States she tested negative for UTI. No reported injuries or unusual activities.

## 2022-10-05 NOTE — ED Notes (Signed)
Pt transport here for xray. Department notified pt cannot get CT scan until IV and sedation have been established.

## 2022-10-06 ENCOUNTER — Telehealth: Payer: Self-pay

## 2022-10-06 NOTE — Transitions of Care (Post Inpatient/ED Visit) (Signed)
   10/06/2022  Name: KATASHA SORELL MRN: [id number] DOB: 05-20-1995  Today's TOC FU Call Status: Today's TOC FU Call Status:: Unsuccessul Call (1st Attempt) Unsuccessful Call (1st Attempt) Date: 10/06/22  Attempted to reach the patient regarding the most recent Inpatient/ED visit.  Follow Up Plan: Additional outreach attempts will be made to reach the patient to complete the Transitions of Care (Post Inpatient/ED visit) call.   Signature   Woodfin Ganja LPN [facility] Nurse Health Advisor Direct Dial [phone]

## 2022-10-11 NOTE — Transitions of Care (Post Inpatient/ED Visit) (Unsigned)
   10/11/2022  Name: [name] [name] MRN: [id number] DOB: [birth year]  Today's TOC FU Call Status: Today's TOC FU Call Status:: Unsuccessful Call (2nd Attempt) Unsuccessful Call (1st Attempt) Date: 10/06/22 Unsuccessful Call (2nd Attempt) Date: 10/11/22  Attempted to reach the patient regarding the most recent Inpatient/ED visit.  Follow Up Plan: Additional outreach attempts will be made to reach the patient to complete the Transitions of Care (Post Inpatient/ED visit) call.   Signature   Woodfin Ganja LPN [facility] Nurse Health Advisor Direct Dial [phone]

## 2022-10-12 NOTE — Transitions of Care (Post Inpatient/ED Visit) (Signed)
   10/12/2022  Name: [name] [name] MRN: [id number] DOB: 02/12/1996  Today's TOC FU Call Status: Today's TOC FU Call Status:: Successful TOC FU Call Competed Unsuccessful Call (1st Attempt) Date: 10/06/22 Unsuccessful Call (2nd Attempt) Date: 10/11/22 Unsuccessful Call (3rd Attempt) Date: 10/12/22 [facility] FU Call Complete Date: 10/12/22  Attempted to reach the patient regarding the most recent Inpatient/ED visit.  Follow Up Plan: No further outreach attempts will be made at this time. We have been unable to contact the patient.  Signature  Woodfin Ganja LPN [facility] Nurse Health Advisor Direct Dial [phone]

## 2023-06-20 ENCOUNTER — Ambulatory Visit: Payer: Self-pay | Admitting: Internal Medicine

## 2023-06-20 NOTE — Telephone Encounter (Signed)
 Difficulty breathing since Fri Diagnosed with bronchitis on Fri Symptoms: Congestion, dry cough, earache, chest pain (6/10) Frequency: Constant Pertinent Negatives: Patient denies fever  Disposition: [x] ED  Additional Notes: Pt states she was diagnosed with bronchitis on Fri of last week. Pt states she is having constant difficulty breathing breathing and 6/10 chest pain. Pt advised to go to ED and have another adult drive pt there. Pt verbalized understanding and agrees to plan.     Copied from CRM [id number]. Topic: Clinical - Red Word Triage >> Jun 20, 2023  9:35 AM Shelah Lewandowsky wrote: Red Word that prompted transfer to Nurse Triage: has bronchitis and is not feeling better, trouble breathing, cough, sore throat, feels like fluid in ears and nasal drainage, headache and body aches Reason for Disposition  [1] MODERATE difficulty breathing (e.g., speaks in phrases, SOB even at rest, pulse 100-120) AND [2] NEW-onset or WORSE than normal  Answer Assessment - Initial Assessment Questions 1. RESPIRATORY STATUS: "Describe your breathing?" (e.g., wheezing, shortness of breath, unable to speak, severe coughing)      Difficulty breathing due to coughing 2. ONSET: "When did this breathing problem begin?"      Since Fri, getting worse; "hurts to breathe" 3. PATTERN "Does the difficult breathing come and go, or has it been constant since it started?"      Constant  Protocols used: Breathing Difficulty-A-AH

## 2023-07-28 ENCOUNTER — Encounter: Payer: Managed Care, Other (non HMO) | Admitting: Internal Medicine

## 2023-08-02 ENCOUNTER — Encounter: Payer: Self-pay | Admitting: Internal Medicine
